# Patient Record
Sex: Female | Born: 1985 | Race: White | Hispanic: No
Health system: Southern US, Community
[De-identification: ages and names within clinical notes are randomized; demographics above are authoritative.]

## PROBLEM LIST (undated history)

## (undated) DIAGNOSIS — O139 Gestational [pregnancy-induced] hypertension without significant proteinuria, unspecified trimester: Secondary | ICD-10-CM

## (undated) DIAGNOSIS — I10 Essential (primary) hypertension: Secondary | ICD-10-CM

## (undated) DIAGNOSIS — R011 Cardiac murmur, unspecified: Secondary | ICD-10-CM

## (undated) DIAGNOSIS — D649 Anemia, unspecified: Secondary | ICD-10-CM

## (undated) HISTORY — PX: NO PAST SURGERIES: SHX2092

---

## 2010-08-05 ENCOUNTER — Ambulatory Visit: Payer: Self-pay | Admitting: Family Medicine

## 2013-10-10 ENCOUNTER — Ambulatory Visit: Payer: Self-pay | Admitting: Oncology

## 2013-10-18 ENCOUNTER — Ambulatory Visit: Payer: Self-pay | Admitting: Oncology

## 2013-10-18 LAB — CBC CANCER CENTER
Basophil #: 0.1 x10 3/mm (ref 0.0–0.1)
Basophil %: 0.5 %
Eosinophil #: 0.3 10*3/uL (ref 0.0–0.7)
Eosinophil %: 2.5 %
HCT: 43 % (ref 35.0–47.0)
HGB: 14.4 g/dL (ref 12.0–16.0)
LYMPHS PCT: 31.9 %
Lymphocyte #: 3.8 x10 3/mm — ABNORMAL HIGH (ref 1.0–3.6)
MCH: 30.2 pg (ref 26.0–34.0)
MCHC: 33.5 g/dL (ref 32.0–36.0)
MCV: 90 fL (ref 80–100)
MONO ABS: 0.9 x10 3/mm (ref 0.2–0.9)
Monocyte %: 7.2 %
NEUTROS ABS: 6.9 x10 3/mm — AB (ref 1.4–6.5)
Neutrophil %: 57.9 %
PLATELETS: 355 x10 3/mm (ref 150–440)
RBC: 4.77 10*6/uL (ref 3.80–5.20)
RDW: 13.1 % (ref 11.5–14.5)
WBC: 12 x10 3/mm — ABNORMAL HIGH (ref 3.6–11.0)

## 2013-10-18 LAB — LACTATE DEHYDROGENASE: LDH: 152 U/L (ref 81–246)

## 2013-10-20 ENCOUNTER — Ambulatory Visit: Payer: Self-pay | Admitting: Oncology

## 2015-11-19 ENCOUNTER — Emergency Department: Payer: Worker's Compensation

## 2015-11-19 ENCOUNTER — Emergency Department
Admission: EM | Admit: 2015-11-19 | Discharge: 2015-11-19 | Disposition: A | Discharge status: Home / Self Care | Payer: Worker's Compensation | Attending: Emergency Medicine | Admitting: Emergency Medicine

## 2015-11-19 DIAGNOSIS — S0990XD Unspecified injury of head, subsequent encounter: Secondary | ICD-10-CM | POA: Diagnosis present

## 2015-11-19 DIAGNOSIS — F0781 Postconcussional syndrome: Secondary | ICD-10-CM | POA: Diagnosis not present

## 2015-11-19 DIAGNOSIS — W2209XD Striking against other stationary object, subsequent encounter: Secondary | ICD-10-CM | POA: Insufficient documentation

## 2015-11-19 DIAGNOSIS — Y9301 Activity, walking, marching and hiking: Secondary | ICD-10-CM | POA: Insufficient documentation

## 2015-11-19 DIAGNOSIS — S0101XD Laceration without foreign body of scalp, subsequent encounter: Secondary | ICD-10-CM | POA: Diagnosis not present

## 2015-11-19 HISTORY — DX: Cardiac murmur, unspecified: R01.1

## 2015-11-19 MED ORDER — NAPROXEN 500 MG PO TABS: 500.0000 mg | ORAL_TABLET | Freq: Two times a day (BID) | ORAL | Status: DC

## 2015-11-19 NOTE — ED Notes (Signed)
Pt states she works at [employer] and when walking through automatic doors the large magnet that opens the door fell onto her head tuesday, denies loc was seen [facility] and had surgical clue for lac repair.the patient c/o continued HA

## 2015-11-19 NOTE — ED Provider Notes (Signed)
[facility] Emergency Department Provider Note  ____________________________________________  Time seen: Approximately 11:19 AM  I have reviewed the triage vital signs and the nursing notes.   HISTORY  Chief Complaint Concussion    HPI Ashley Santos is a 30 y.o. female patient complaining of left frontal headache secondary to blunt trauma. Patient states while walking through an automatic door the magnet that holds the door open fell striking her on the top left side of her head. Patient stated there was no loss of consciousness and was sent to the urgent care secondary to have a scalp laceration. Patient states since the incident she's had a severe headache is not controlled with over-the-counter medications. Patient denies any vertigo or vision disturbance. Patient is currently rating the pain is 3/10.   Past Medical History  Diagnosis Date   Murmur, cardiac     There are no active problems to display for this patient.   History reviewed. No pertinent past surgical history.  No current outpatient prescriptions on file.  Allergies Review of patient's allergies indicates no known allergies.  No family history on file.  Social History Social History  Substance Use Topics   Smoking status: Never Smoker    Smokeless tobacco: None   Alcohol Use: Yes    Review of Systems Constitutional: No fever/chills Eyes: No visual changes. ENT: No sore throat. Cardiovascular: Denies chest pain. Respiratory: Denies shortness of breath. Gastrointestinal: No abdominal pain.  No nausea, no vomiting.  No diarrhea.  No constipation. Genitourinary: Negative for dysuria. Musculoskeletal: Negative for back pain. Skin: Negative for rash. Neurological: Positive for left frontal headaches, but denies focal weakness or numbness.   ____________________________________________   PHYSICAL EXAM:  VITAL SIGNS: ED Triage Vitals  Enc Vitals Group     BP  11/19/15 1050 121/86 mmHg     Pulse Rate 11/19/15 1050 72     Resp 11/19/15 1050 16     Temp 11/19/15 1050 97.5 F (36.4 C)     Temp Source 11/19/15 1050 Oral     SpO2 11/19/15 1050 99 %     Weight 11/19/15 1050 180 lb (81.647 kg)     Height 11/19/15 1050 5\' 1"  (1.549 m)     Head Cir --      Peak Flow --      Pain Score 11/19/15 1050 3     Pain Loc --      Pain Edu? --      Excl. in [place]? --     Constitutional: Alert and oriented. Well appearing and in no acute distress. Eyes: Conjunctivae are normal. PERRL. EOMI. Head: Atraumatic. Lacerations. Left aspect of scalp Nose: No congestion/rhinnorhea. Mouth/Throat: Mucous membranes are moist.  Oropharynx non-erythematous. Neck: No stridor.  No cervical spine tenderness to palpation. Hematological/Lymphatic/Immunilogical: No cervical lymphadenopathy. Cardiovascular: Normal rate, regular rhythm. Grossly normal heart sounds.  Good peripheral circulation. Respiratory: Normal respiratory effort.  No retractions. Lungs CTAB. Gastrointestinal: Soft and nontender. No distention. No abdominal bruits. No CVA tenderness. Musculoskeletal: No lower extremity tenderness nor edema.  No joint effusions. Neurologic:  Normal speech and language. No gross focal neurologic deficits are appreciated. No gait instability. Skin:  Skin is warm, dry and intact. No rash noted. Psychiatric: Mood and affect are normal. Speech and behavior are normal.  ____________________________________________   LABS (all labs ordered are listed, but only abnormal results are displayed)  Labs Reviewed - No data to display ____________________________________________  EKG   ____________________________________________  RADIOLOGY  No acute  finding on head CT ____________________________________________   PROCEDURES  Procedure(s) performed: None  Critical Care performed: No  ____________________________________________   INITIAL IMPRESSION / ASSESSMENT AND  PLAN / ED COURSE  Pertinent labs & imaging results that were available during my care of the patient were reviewed by me and considered in my medical decision making (see chart for details).  Postconcussion headache. Patient given discharge Instructions. Patient advised to follow-up with [facility] if no improvement in 5 days. Patient a prescription for naproxen. ____________________________________________   FINAL CLINICAL IMPRESSION(S) / ED DIAGNOSES  Final diagnoses:  Postconcussion syndrome      Sable Feil, PA-C 11/19/15 [place], MD 11/19/15 [phone]

## 2016-06-07 ENCOUNTER — Other Ambulatory Visit: Payer: Self-pay

## 2016-06-07 ENCOUNTER — Ambulatory Visit
Admission: RE | Admit: 2016-06-07 | Discharge: 2016-06-07 | Disposition: A | Discharge status: Home / Self Care | Payer: 59 | Source: Ambulatory Visit

## 2016-06-07 DIAGNOSIS — R112 Nausea with vomiting, unspecified: Secondary | ICD-10-CM

## 2016-06-07 DIAGNOSIS — K2100 Gastro-esophageal reflux disease with esophagitis, without bleeding: Secondary | ICD-10-CM

## 2016-06-13 ENCOUNTER — Other Ambulatory Visit (HOSPITAL_COMMUNITY): Payer: Self-pay | Admitting: Physician Assistant

## 2016-06-13 DIAGNOSIS — R1011 Right upper quadrant pain: Principal | ICD-10-CM

## 2016-06-13 DIAGNOSIS — G8929 Other chronic pain: Secondary | ICD-10-CM

## 2016-06-13 DIAGNOSIS — R112 Nausea with vomiting, unspecified: Secondary | ICD-10-CM

## 2016-06-21 ENCOUNTER — Ambulatory Visit (HOSPITAL_COMMUNITY)
Admission: RE | Admit: 2016-06-21 | Discharge: 2016-06-21 | Disposition: A | Discharge status: Home / Self Care | Payer: 59 | Source: Ambulatory Visit | Attending: Physician Assistant | Admitting: Physician Assistant

## 2016-06-21 DIAGNOSIS — R1010 Upper abdominal pain, unspecified: Secondary | ICD-10-CM | POA: Insufficient documentation

## 2016-06-21 DIAGNOSIS — G8929 Other chronic pain: Secondary | ICD-10-CM

## 2016-06-21 DIAGNOSIS — R1011 Right upper quadrant pain: Secondary | ICD-10-CM

## 2016-06-21 DIAGNOSIS — R112 Nausea with vomiting, unspecified: Secondary | ICD-10-CM | POA: Diagnosis present

## 2016-06-21 MED ORDER — TECHNETIUM TC 99M EXAMETAZIME IV KIT
5.2000 | PACK | Freq: Once | INTRAVENOUS | Status: AC | PRN
  Administered 2016-06-21: 5 via INTRAVENOUS

## 2018-08-01 LAB — OB RESULTS CONSOLE HIV ANTIBODY (ROUTINE TESTING): HIV: NONREACTIVE

## 2018-08-01 LAB — OB RESULTS CONSOLE ABO/RH: RH Type: POSITIVE

## 2018-08-01 LAB — OB RESULTS CONSOLE RUBELLA ANTIBODY, IGM: Rubella: NON-IMMUNE/NOT IMMUNE

## 2018-08-01 LAB — OB RESULTS CONSOLE RPR: RPR: NONREACTIVE

## 2018-08-01 LAB — OB RESULTS CONSOLE ANTIBODY SCREEN: Antibody Screen: NEGATIVE

## 2018-08-01 LAB — OB RESULTS CONSOLE HEPATITIS B SURFACE ANTIGEN: Hepatitis B Surface Ag: NEGATIVE

## 2018-08-15 ENCOUNTER — Ambulatory Visit: Admit: 2018-08-15 | Payer: 59 | Admitting: Obstetrics and Gynecology

## 2018-08-15 SURGERY — LAPAROSCOPY, DIAGNOSTIC
Anesthesia: Choice

## 2018-09-19 NOTE — L&D Delivery Note (Signed)
Delivery Note Late Entry delivery note:  Called to room for precipitous delivery.  MD on the way.   At 7:30 AM a viable and healthy female was delivered via Vaginal, Spontaneous (Presentation: OA).  APGAR: 5, 6; weight 5 lb 5.5 oz (2424 g).   Placenta status: spontaneous and grossly intact with 3 vessel Cord:  with the following complications: none  Anesthesia:  none Episiotomy: None Lacerations: 1st degree;Labial;Perineal Suture Repair: per Dr Simona Huh Est. Blood Loss (mL): 100  Mom to postpartum.  Baby to Couplet care / Skin to Skin.  Hansel Feinstein 03/05/2019, 8:50 AM

## 2018-09-19 NOTE — L&D Delivery Note (Signed)
Delivery Note Upon my arrival, baby and placenta delivered.  Baby skin to skin.  Mother doing well.  No active bleeding from vagina.  Anesthesia:  Lidocaine Episiotomy: None Lacerations: 1st degree;Labial;Perineal Suture Repair: 2.0 3.0 chromic Est. Blood Loss (mL):  100  Mom to postpartum.  Baby to Couplet care / Skin to Skin.  Thurnell Lose 02/20/2019, 8:30 AM

## 2019-02-19 ENCOUNTER — Inpatient Hospital Stay (HOSPITAL_COMMUNITY)
Admission: AD | Admit: 2019-02-19 | Discharge: 2019-02-23 | DRG: 805 | Disposition: A | Discharge status: Home / Self Care | Payer: Managed Care, Other (non HMO) | Attending: Obstetrics and Gynecology | Admitting: Obstetrics and Gynecology

## 2019-02-19 ENCOUNTER — Encounter (HOSPITAL_COMMUNITY): Payer: Self-pay | Admitting: *Deleted

## 2019-02-19 ENCOUNTER — Other Ambulatory Visit: Payer: Self-pay

## 2019-02-19 ENCOUNTER — Inpatient Hospital Stay (HOSPITAL_COMMUNITY): Payer: Managed Care, Other (non HMO)

## 2019-02-19 DIAGNOSIS — H47619 Cortical blindness, unspecified side of brain: Secondary | ICD-10-CM | POA: Diagnosis present

## 2019-02-19 DIAGNOSIS — Z1159 Encounter for screening for other viral diseases: Secondary | ICD-10-CM | POA: Diagnosis not present

## 2019-02-19 DIAGNOSIS — I6783 Posterior reversible encephalopathy syndrome: Secondary | ICD-10-CM | POA: Diagnosis present

## 2019-02-19 DIAGNOSIS — O9989 Other specified diseases and conditions complicating pregnancy, childbirth and the puerperium: Secondary | ICD-10-CM | POA: Diagnosis present

## 2019-02-19 DIAGNOSIS — O1413 Severe pre-eclampsia, third trimester: Secondary | ICD-10-CM | POA: Diagnosis present

## 2019-02-19 DIAGNOSIS — O9081 Anemia of the puerperium: Secondary | ICD-10-CM | POA: Diagnosis not present

## 2019-02-19 DIAGNOSIS — Z3A36 36 weeks gestation of pregnancy: Secondary | ICD-10-CM | POA: Diagnosis not present

## 2019-02-19 DIAGNOSIS — O9942 Diseases of the circulatory system complicating childbirth: Secondary | ICD-10-CM | POA: Diagnosis present

## 2019-02-19 DIAGNOSIS — O99824 Streptococcus B carrier state complicating childbirth: Secondary | ICD-10-CM | POA: Diagnosis present

## 2019-02-19 DIAGNOSIS — I340 Nonrheumatic mitral (valve) insufficiency: Secondary | ICD-10-CM | POA: Diagnosis present

## 2019-02-19 DIAGNOSIS — R51 Headache: Secondary | ICD-10-CM | POA: Diagnosis present

## 2019-02-19 DIAGNOSIS — H53459 Other localized visual field defect, unspecified eye: Secondary | ICD-10-CM | POA: Diagnosis present

## 2019-02-19 DIAGNOSIS — O1414 Severe pre-eclampsia complicating childbirth: Principal | ICD-10-CM | POA: Diagnosis present

## 2019-02-19 DIAGNOSIS — Z3A39 39 weeks gestation of pregnancy: Secondary | ICD-10-CM | POA: Diagnosis not present

## 2019-02-19 LAB — COMPREHENSIVE METABOLIC PANEL
ALT: 14 U/L (ref 0–44)
ALT: 14 U/L (ref 0–44)
AST: 20 U/L (ref 15–41)
AST: 20 U/L (ref 15–41)
Albumin: 2.6 g/dL — ABNORMAL LOW (ref 3.5–5.0)
Albumin: 2.5 g/dL — ABNORMAL LOW (ref 3.5–5.0)
Alkaline Phosphatase: 125 U/L (ref 38–126)
Alkaline Phosphatase: 123 U/L (ref 38–126)
Anion gap: 10 (ref 5–15)
Anion gap: 10 (ref 5–15)
BUN: 6 mg/dL (ref 6–20)
BUN: 8 mg/dL (ref 6–20)
CO2: 22 mmol/L (ref 22–32)
CO2: 22 mmol/L (ref 22–32)
Calcium: 9.4 mg/dL (ref 8.9–10.3)
Calcium: 8.5 mg/dL — ABNORMAL LOW (ref 8.9–10.3)
Chloride: 107 mmol/L (ref 98–111)
Chloride: 105 mmol/L (ref 98–111)
Creatinine, Ser: 0.65 mg/dL (ref 0.44–1.00)
Creatinine, Ser: 0.71 mg/dL (ref 0.44–1.00)
GFR calc Af Amer: >60 mL/min (ref >60)
GFR calc Af Amer: >60 mL/min (ref >60)
GFR calc non Af Amer: >60 mL/min (ref >60)
GFR calc non Af Amer: >60 mL/min (ref >60)
Glucose, Bld: 93 mg/dL (ref 70–99)
Glucose, Bld: 127 mg/dL — ABNORMAL HIGH (ref 70–99)
Potassium: 3.4 mmol/L — ABNORMAL LOW (ref 3.5–5.1)
Potassium: 3.6 mmol/L (ref 3.5–5.1)
Sodium: 139 mmol/L (ref 135–145)
Sodium: 137 mmol/L (ref 135–145)
Total Bilirubin: 0.5 mg/dL (ref 0.3–1.2)
Total Bilirubin: 0.5 mg/dL (ref 0.3–1.2)
Total Protein: 6.3 g/dL — ABNORMAL LOW (ref 6.5–8.1)
Total Protein: 6.3 g/dL — ABNORMAL LOW (ref 6.5–8.1)

## 2019-02-19 LAB — CBC
HCT: 35.4 % — ABNORMAL LOW (ref 36.0–46.0)
Hemoglobin: 12.7 g/dL (ref 12.0–15.0)
MCH: 32.3 pg (ref 26.0–34.0)
MCHC: 35.9 g/dL (ref 30.0–36.0)
MCV: 90.1 fL (ref 80.0–100.0)
Platelets: 252 10*3/uL (ref 150–400)
RBC: 3.93 MIL/uL (ref 3.87–5.11)
RDW: 13.1 % (ref 11.5–15.5)
WBC: 17.3 10*3/uL — ABNORMAL HIGH (ref 4.0–10.5)
nRBC: 0 % (ref 0.0–0.2)

## 2019-02-19 LAB — TYPE AND SCREEN
ABO/RH(D): O POS
Antibody Screen: NEGATIVE

## 2019-02-19 LAB — MAGNESIUM: Magnesium: 4.6 mg/dL — ABNORMAL HIGH (ref 1.7–2.4)

## 2019-02-19 LAB — PROTEIN / CREATININE RATIO, URINE
Creatinine, Urine: 33.82 mg/dL
Protein Creatinine Ratio: 4.38 mg/mg{Cre} — ABNORMAL HIGH (ref 0.00–0.15)
Total Protein, Urine: 148 mg/dL

## 2019-02-19 LAB — SARS CORONAVIRUS 2 BY RT PCR (HOSPITAL ORDER, PERFORMED IN ~~LOC~~ HOSPITAL LAB): SARS Coronavirus 2: NEGATIVE

## 2019-02-19 MED ORDER — SODIUM CHLORIDE 0.9 % IV SOLN
5.0000 10*6.[IU] | Freq: Once | INTRAVENOUS | Status: AC
  Administered 2019-02-19: 5 10*6.[IU] via INTRAVENOUS
  Filled 2019-02-19: qty 5

## 2019-02-19 MED ORDER — LABETALOL HCL 200 MG PO TABS
200.0000 mg | ORAL_TABLET | Freq: Two times a day (BID) | ORAL | Status: DC
  Administered 2019-02-19: 200 mg via ORAL
  Filled 2019-02-19: qty 1

## 2019-02-19 MED ORDER — FENTANYL CITRATE (PF) 100 MCG/2ML IJ SOLN
50.0000 ug | INTRAMUSCULAR | Status: DC | PRN
  Administered 2019-02-19 – 2019-02-20 (×7): 50 ug via INTRAVENOUS
  Filled 2019-02-19 (×7): qty 2

## 2019-02-19 MED ORDER — LACTATED RINGERS IV SOLN: 500.0000 mL | INTRAVENOUS | Status: DC | PRN

## 2019-02-19 MED ORDER — PENICILLIN G 3 MILLION UNITS IVPB - SIMPLE MED
3.0000 10*6.[IU] | INTRAVENOUS | Status: DC
  Administered 2019-02-19 – 2019-02-20 (×4): 3 10*6.[IU] via INTRAVENOUS
  Filled 2019-02-19 (×7): qty 100

## 2019-02-19 MED ORDER — LABETALOL HCL 5 MG/ML IV SOLN
40.0000 mg | INTRAVENOUS | Status: DC | PRN
  Administered 2019-02-19: 40 mg via INTRAVENOUS
  Filled 2019-02-19: qty 8

## 2019-02-19 MED ORDER — ACETAMINOPHEN 325 MG PO TABS: 650.0000 mg | ORAL_TABLET | ORAL | Status: DC | PRN

## 2019-02-19 MED ORDER — ONDANSETRON HCL 4 MG/2ML IJ SOLN
4.0000 mg | Freq: Four times a day (QID) | INTRAMUSCULAR | Status: DC | PRN
  Administered 2019-02-19 – 2019-02-20 (×2): 4 mg via INTRAVENOUS
  Filled 2019-02-19 (×3): qty 2

## 2019-02-19 MED ORDER — SOD CITRATE-CITRIC ACID 500-334 MG/5ML PO SOLN: 30.0000 mL | ORAL | Status: DC | PRN

## 2019-02-19 MED ORDER — LACTATED RINGERS IV SOLN
INTRAVENOUS | Status: DC
  Administered 2019-02-19 – 2019-02-20 (×4): via INTRAVENOUS

## 2019-02-19 MED ORDER — MAGNESIUM SULFATE 40 G IN LACTATED RINGERS - SIMPLE
2.0000 g/h | INTRAVENOUS | Status: DC
  Administered 2019-02-19 – 2019-02-20 (×3): 2 g/h via INTRAVENOUS
  Filled 2019-02-19 (×2): qty 500

## 2019-02-19 MED ORDER — TERBUTALINE SULFATE 1 MG/ML IJ SOLN: 0.2500 mg | Freq: Once | INTRAMUSCULAR | Status: DC | PRN

## 2019-02-19 MED ORDER — PROMETHAZINE HCL 25 MG/ML IJ SOLN
12.5000 mg | Freq: Four times a day (QID) | INTRAMUSCULAR | Status: DC | PRN
  Administered 2019-02-19: 12.5 mg via INTRAVENOUS
  Filled 2019-02-19: qty 1

## 2019-02-19 MED ORDER — BUTORPHANOL TARTRATE 1 MG/ML IJ SOLN: 1.0000 mg | INTRAMUSCULAR | Status: DC | PRN

## 2019-02-19 MED ORDER — LABETALOL HCL 200 MG PO TABS
200.0000 mg | ORAL_TABLET | Freq: Two times a day (BID) | ORAL | Status: DC
  Administered 2019-02-20 (×2): 200 mg via ORAL
  Filled 2019-02-19 (×2): qty 1

## 2019-02-19 MED ORDER — MISOPROSTOL 25 MCG QUARTER TABLET
25.0000 ug | ORAL_TABLET | ORAL | Status: DC | PRN
  Filled 2019-02-19: qty 1

## 2019-02-19 MED ORDER — ONDANSETRON HCL 4 MG/2ML IJ SOLN
4.0000 mg | Freq: Once | INTRAMUSCULAR | Status: AC
  Administered 2019-02-19: 4 mg via INTRAVENOUS

## 2019-02-19 MED ORDER — OXYCODONE-ACETAMINOPHEN 5-325 MG PO TABS: 1.0000 | ORAL_TABLET | ORAL | Status: DC | PRN

## 2019-02-19 MED ORDER — OXYTOCIN 40 UNITS IN NORMAL SALINE INFUSION - SIMPLE MED
1.0000 m[IU]/min | INTRAVENOUS | Status: DC
  Administered 2019-02-19: 1 m[IU]/min via INTRAVENOUS
  Administered 2019-02-20: 7 m[IU]/min via INTRAVENOUS

## 2019-02-19 MED ORDER — BUTALBITAL-APAP-CAFFEINE 50-325-40 MG PO TABS
1.0000 | ORAL_TABLET | ORAL | Status: AC
  Administered 2019-02-19: 1 via ORAL
  Filled 2019-02-19: qty 1

## 2019-02-19 MED ORDER — OXYCODONE-ACETAMINOPHEN 5-325 MG PO TABS: 2.0000 | ORAL_TABLET | ORAL | Status: DC | PRN

## 2019-02-19 MED ORDER — LIDOCAINE HCL (PF) 1 % IJ SOLN
30.0000 mL | INTRAMUSCULAR | Status: AC | PRN
  Administered 2019-02-20: 30 mL via SUBCUTANEOUS
  Filled 2019-02-19: qty 30

## 2019-02-19 MED ORDER — OXYTOCIN 40 UNITS IN NORMAL SALINE INFUSION - SIMPLE MED
2.5000 [IU]/h | INTRAVENOUS | Status: DC
  Administered 2019-02-20: 2.5 [IU]/h via INTRAVENOUS
  Filled 2019-02-19 (×2): qty 1000

## 2019-02-19 MED ORDER — OXYTOCIN BOLUS FROM INFUSION
500.0000 mL | Freq: Once | INTRAVENOUS | Status: AC
  Administered 2019-02-20: 500 mL via INTRAVENOUS

## 2019-02-19 MED ORDER — LABETALOL HCL 5 MG/ML IV SOLN
20.0000 mg | INTRAVENOUS | Status: DC | PRN
  Administered 2019-02-19: 20 mg via INTRAVENOUS
  Filled 2019-02-19: qty 4

## 2019-02-19 MED ORDER — OXYTOCIN 40 UNITS IN NORMAL SALINE INFUSION - SIMPLE MED: 1.0000 m[IU]/min | INTRAVENOUS | Status: DC

## 2019-02-19 MED ORDER — HYDRALAZINE HCL 20 MG/ML IJ SOLN
10.0000 mg | INTRAMUSCULAR | Status: DC | PRN
  Administered 2019-02-19: 10 mg via INTRAVENOUS

## 2019-02-19 MED ORDER — HYDRALAZINE HCL 20 MG/ML IJ SOLN
5.0000 mg | INTRAMUSCULAR | Status: DC | PRN
  Administered 2019-02-19: 5 mg via INTRAVENOUS
  Filled 2019-02-19: qty 1

## 2019-02-19 NOTE — Progress Notes (Signed)
Ashley Santos is a 33 y.o. G1P0 at [redacted]w[redacted]d admitted for induction of labor due to Pre-eclamptic toxemia of pregnancy with severe features complicated by PRES syndrome.  Subjective: Patient reports feeling better, her headache is improving. Her vision is also improving. She is able to see movement, light, and shadows but cannot clearly see outlines. RN alerted me to the fact that anesthesia would not clear this patient for epidural considering PRES syndrome, patient made aware.  Plan of care made with Dr. Charlesetta Garibaldi and Dr. Simona Huh with Dr. Shon Baton input. Patient is stable for continued induction of labor as long as there is fetal tolerance of induction, patient's symptoms are not worsening, and her blood pressures remain controlled. Patient made aware that if any of these change, we may discuss cesarean section. Patient made aware if we recommended cesarean, she would receive general anesthesia per anesthesiology.   Objective: Vitals:   02/19/19 1832 02/19/19 1902 02/19/19 1930 02/19/19 2000  BP: (!) 148/97 (!) 146/89 123/87 (!) 153/100  Pulse: 79 76 91 92  Resp: 20 18 18 20   Temp:   98.5 F (36.9 C)   TempSrc:   Oral   SpO2:   99%   Weight:      Height:        FHT:  FHR: 130s bpm, variability: moderate,  accelerations:  Abscent,  decelerations:  Absent UC:   irregular, every 1-3 minutes SVE:   Dilation: 1 Effacement (%): 50 Station: -2 Exam by:: Ranee Gosselin CNM   Physical Exam  Constitutional: She is oriented to person, place, and time. She appears lethargic.  Eyes: Pupils are equal, round, and reactive to light. EOM are normal.  Neurological: She is oriented to person, place, and time. She has normal strength. She appears lethargic. A sensory deficit is present.   CNII: patient can see light, shadow, and movement but not distinct shapes CNIII,IV,VI: PERLA, EOM intact VII: symmetrical movement of face VIII: patient able to hear clearly XI: symmetric shoulder shrug with adequate  force XII: symmetric movement of tongue   Labs: Lab Results  Component Value Date   WBC 17.3 (H) 02/19/2019   HGB 12.7 02/19/2019   HCT 35.4 (L) 02/19/2019   MCV 90.1 02/19/2019   PLT 252 02/19/2019    Assessment / Plan: Induction of labor due to preeclampsia,  progressing well on pitocin  Labor: Progressing normally Preeclampsia:  on magnesium sulfate, no signs or symptoms of toxicity, intake and ouput balanced and labs stable Fetal Wellbeing:  Category I Pain Control:  Labor support without medications I/D:  n/a Anticipated MOD:  NSVD  Marikay Alar 02/19/2019, 8:19 PM

## 2019-02-19 NOTE — MAU Note (Signed)
Admission COVID swab obtained. Pt asymptomatic. Specimen sent to lab.

## 2019-02-19 NOTE — H&P (Addendum)
Ashley Santos is a 33 y.o. female G1 @ 36 5/7 weeks brought in by EMS due to severe headache and unilateral blurry vision.  Upon arrival to 181/111, 187/117.  Pt started having neck pain with point tenderness a the base of her neck 1 week ago.  Pt thought she slept wrong so I prescribed her Flexeril, which she states did not help.  Two days later, she went to a [facility] in Clinic with a more pain and headache.  BP was "190 over something" but was told it was normal b/c she was in pain. Pt reports she had blurry vision in her left eye.  She now can only see light in both eyes.  She can not see shadows.  Headache is 7/10.  Has not resolved.  Pt denies upper abdominal pain.   [place] with [facility] has been complicated by +Chlamydia in the first trimester.  TOC neg, repeat at 36 weeks was negative.  Pt has a h/o mitral regurgitation without issue.  OB History    Gravida  1   Para      Term      Preterm      AB      Living        SAB      TAB      Ectopic      Multiple      Live Births             Past Medical History:  Diagnosis Date   Murmur, cardiac    Past Surgical History:  Procedure Laterality Date   NO PAST SURGERIES     Family History: family history includes Heart disease in her mother. Social History:  reports that she has never smoked. She has never used smokeless tobacco. She reports current alcohol use. She reports that she does not use drugs.     Maternal Diabetes: No Genetic Screening: Normal Maternal Ultrasounds/Referrals: Normal Fetal Ultrasounds or other Referrals:  None Maternal Substance Abuse:  No Significant Maternal Medications:  None Significant Maternal Lab Results:  Lab values include: Group B Strep positive Other Comments:  Preeclampsia with severe features  Review of Systems  HENT:       Having difficulty hearing.  Eyes: Positive for blurred vision.  Respiratory: Negative for shortness of breath.   Cardiovascular: Negative for chest  pain.  Gastrointestinal: Positive for abdominal pain.  Musculoskeletal: Positive for neck pain.  Neurological: Positive for headaches.   Maternal Medical History:  Contractions: Perceived severity is moderate.    Fetal activity: Perceived fetal activity is normal.    Prenatal complications: no prenatal complications Prenatal Complications - Diabetes: none.    Dilation: 1 Effacement (%): 50 Station: Ballotable Exam by:: Cecelia Byars, RN Blood pressure (!) 166/103, pulse 89, temperature 98.6 F (37 C), temperature source Oral, resp. rate 20, height 5\' 1"  (1.549 m), weight 83.9 kg, SpO2 98 %. Maternal Exam:  Uterine Assessment: Contraction strength is moderate.  Contraction frequency is regular.   Abdomen: Patient reports no abdominal tenderness. Estimated fetal weight is 6.5 lbs.   Fetal presentation: vertex  Introitus: not evaluated.     Fetal Exam Fetal Monitor Review: Mode: fetoscope.   Baseline rate: 140s.  Variability: moderate (6-25 bpm).   Pattern: accelerations present and no decelerations.    Fetal State Assessment: Category I - tracings are normal.     Physical Exam  Constitutional: She is oriented to person, place, and time. She appears well-developed and well-nourished. She  appears distressed.  Pt seems to have an increased pain with headache with contractions.  HENT:  Head: Normocephalic and atraumatic.  Eyes: EOM are normal.  Fixed pupils bilaterally.  Neck: Normal range of motion.  Cardiovascular: Normal rate and regular rhythm.  Respiratory: Effort normal and breath sounds normal. She has no wheezes.  GI: There is no abdominal tenderness.  Musculoskeletal: Normal range of motion.        General: Edema present. No tenderness.  Neurological: She is alert and oriented to person, place, and time.  3+  Skin: Skin is warm and dry.  Psychiatric:  Answers questions appropriately.    Prenatal labs: ABO, Rh: --/--/O POS (06/02 1145) Antibody:  PENDING (06/02 1145) Rubella: Nonimmune (11/13 1159) RPR: Nonreactive (11/13 1159)  HBsAg: Negative (11/13 1159)  HIV: Non-reactive (11/13 1159)  GBS:   Positive  CBC    Component Value Date/Time   WBC 17.3 (H) 02/19/2019 1148   RBC 3.93 02/19/2019 1148   HGB 12.7 02/19/2019 1148   HGB 14.4 10/18/2013 1044   HCT 35.4 (L) 02/19/2019 1148   HCT 43.0 10/18/2013 1044   PLT 252 02/19/2019 1148   PLT 355 10/18/2013 1044   MCV 90.1 02/19/2019 1148   MCV 90 10/18/2013 1044   MCH 32.3 02/19/2019 1148   MCHC 35.9 02/19/2019 1148   RDW 13.1 02/19/2019 1148   RDW 13.1 10/18/2013 1044   LYMPHSABS 3.8 (H) 10/18/2013 1044   MONOABS 0.9 10/18/2013 1044   EOSABS 0.3 10/18/2013 1044   BASOSABS 0.1 10/18/2013 1044   CMP     Component Value Date/Time   NA 139 02/19/2019 1148   K 3.4 (L) 02/19/2019 1148   CL 107 02/19/2019 1148   CO2 22 02/19/2019 1148   GLUCOSE 93 02/19/2019 1148   BUN 6 02/19/2019 1148   CREATININE 0.65 02/19/2019 1148   CALCIUM 9.4 02/19/2019 1148   PROT 6.3 (L) 02/19/2019 1148   ALBUMIN 2.6 (L) 02/19/2019 1148   AST 20 02/19/2019 1148   ALT 14 02/19/2019 1148   ALKPHOS 125 02/19/2019 1148   BILITOT 0.5 02/19/2019 1148   GFRNONAA >60 02/19/2019 1148   GFRAA >60 02/19/2019 1148   PC  Assessment/Plan: IUP @ 36 5/7 weeks Preeclampsia with severe features by BP and visual changes.  -Magnesium bolus started in route to ER.  2 gram infusion started on admission.  Check Magnesium level  in 6 hours and adjust accordingly.  -Due to physical exam findings, Neurology consult obtained.  Consulted with Dr. Delma Freeze.  He recommended  a stat MRI.  Suspects PRES.  He also recommends being aggressive managing her BP.  Pt was given IV  Hydralazine 5,10 and Labetalol 20 IV.  Start Labetalol 200 mg BID.  -Induction of labor.  Pt contracting regularly.  Start Pitocin after MRI.  -Fioricet given for headache.  If no improvement, try IV medication. GBS+  Start Penicillin. H/o Mitral  regurgitation.  Normal exam, asymptomatic. Plan for vaginal delivery, c-section as indicated. Plan of care d/w Dr. Gertie Exon, MFM extensively.  Agrees with above POC. Ok to take pt off unit to do MRI to assess for more concerning conditions.  I d/w RN and requested she go with pt to MRI and perform dopplers immediately before and after MRI.     Thurnell Lose 02/19/2019, 12:29 PM

## 2019-02-19 NOTE — Consult Note (Addendum)
Neurology Consultation  Reason for Consult: HA/ Loss of vision/  Referring Physician: Irving Burton   History is obtained from: daughter  HPI: Ashley Santos is a 33 y.o. female Cardiac Murmur and currently pregnant. Patient states she went to sleep at 2230 on 02/19/2019 and had a HA along with neck pain. Upon waking today she had spots in her vision which then became loss of vision. 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 She had no other symptoms other than nausea.    LKW: 22:30 02/19/2019 tpa given?: no, no focal deficits Premorbid modified Rankin scale (mRS): 0  ROS: A 14 point ROS was performed and is negative except as noted in the HPI.   Past Medical History:  Diagnosis Date   Murmur, cardiac     Family History  Problem Relation Age of Onset   Heart disease Mother    Social History:   reports that she has never smoked. She has never used smokeless tobacco. She reports current alcohol use. She reports that she does not use drugs.  Medications  Current Facility-Administered Medications:    acetaminophen (TYLENOL) tablet 650 mg, 650 mg, Oral, Q4H PRN, Thurnell Lose, MD   butorphanol (STADOL) injection 1 mg, 1 mg, Intravenous, Q1H PRN, Thurnell Lose, MD   hydrALAZINE (APRESOLINE) injection 5 mg, 5 mg, Intravenous, PRN, 5 mg at 02/19/19 1127 **AND** hydrALAZINE (APRESOLINE) injection 10 mg, 10 mg, Intravenous, PRN, 10 mg at 02/19/19 1140 **AND** labetalol (NORMODYNE) injection 20 mg, 20 mg, Intravenous, PRN, 20 mg at 02/19/19 1150 **AND** labetalol (NORMODYNE) injection 40 mg, 40 mg, Intravenous, PRN, 40 mg at 02/19/19 1218 **AND** Measure blood pressure, , , Once, Thurnell Lose, MD   labetalol (NORMODYNE) tablet 200 mg, 200 mg, Oral, BID, Thurnell Lose, MD, 200 mg at 02/19/19 1307   lactated ringers infusion 500-1,000 mL, 500-1,000 mL, Intravenous, PRN, Thurnell Lose, MD   lactated ringers infusion, , Intravenous, Continuous, Thurnell Lose, MD,  Stopped at 02/19/19 1256   lidocaine (PF) (XYLOCAINE) 1 % injection 30 mL, 30 mL, Subcutaneous, PRN, Thurnell Lose, MD   magnesium sulfate 40 grams in LR 500 mL OB infusion, 2 g/hr, Intravenous, Continuous, Thurnell Lose, MD, Last Rate: 25 mL/hr at 02/19/19 1300, 2 g/hr at 02/19/19 1300   misoprostol (CYTOTEC) tablet 25 mcg, 25 mcg, Vaginal, Q4H PRN, Thurnell Lose, MD   ondansetron [facility]) injection 4 mg, 4 mg, Intravenous, Q6H PRN, Thurnell Lose, MD, 4 mg at 02/19/19 1232   oxyCODONE-acetaminophen (PERCOCET/ROXICET) 5-325 MG per tablet 1 tablet, 1 tablet, Oral, Q4H PRN, Thurnell Lose, MD   oxyCODONE-acetaminophen (PERCOCET/ROXICET) 5-325 MG per tablet 2 tablet, 2 tablet, Oral, Q4H PRN, Thurnell Lose, MD   oxytocin (PITOCIN) IV BOLUS FROM BAG, 500 mL, Intravenous, Once, Thurnell Lose, MD   oxytocin (PITOCIN) IV infusion 40 units in NS 1000 mL - Premix, 2.5 Units/hr, Intravenous, Continuous, Thurnell Lose, MD   oxytocin (PITOCIN) IV infusion 40 units in NS 1000 mL - Premix, 1-40 milli-units/min, Intravenous, Titrated, Thurnell Lose, MD   [COMPLETED] penicillin G potassium 5 Million Units in sodium chloride 0.9 % 250 mL IVPB, 5 Million Units, Intravenous, Once, Last Rate: 250 mL/hr at 02/19/19 1254, 5 Million Units at 02/19/19 1254 **FOLLOWED BY** penicillin G 3 million units in sodium chloride 0.9% 100 mL IVPB, 3 Million Units, Intravenous, Q4H, Thurnell Lose, MD   sodium citrate-citric acid (ORACIT) solution 30 mL, 30 mL, Oral, Q2H PRN, Thurnell Lose, MD   terbutaline (BRETHINE) injection 0.25 mg, 0.25 mg, Subcutaneous,  Once PRN, Thurnell Lose, MD   terbutaline (BRETHINE) injection 0.25 mg, 0.25 mg, Subcutaneous, Once PRN, Thurnell Lose, MD   Exam: Current vital signs: BP (!) 142/82    Pulse 85    Temp 98.6 F (37 C) (Oral)    Resp 18    Ht 5\' 1"  (1.549 m)    Wt 83.9 kg    SpO2 98%    BMI 34.96 kg/m  Vital signs in last 24 hours: Temp:  [98.6 F (37 C)]  98.6 F (37 C) (06/02 1110) Pulse Rate:  [79-97] 85 (06/02 1248) Resp:  [18-20] 18 (06/02 1248) BP: (142-187)/(45-121) 142/82 (06/02 1350) SpO2:  [98 %] 98 % (06/02 1248) Weight:  [83.9 kg] 83.9 kg (06/02 1121)  Physical Exam  Constitutional: Appears well-developed and well-nourished.  Psych: Affect appropriate to situation Eyes: No scleral injection HENT: No OP obstrucion Head: Normocephalic.  Cardiovascular: Normal rate and regular rhythm.  Respiratory: Effort normal, non-labored breathing GI: Soft.  No distension. There is no tenderness.  Skin: WDI  Neuro: Mental Status: Patient is awake, alert, oriented to person, place, month, year, and situation. Patient is able to give a clear and coherent history. No signs of aphasia or neglect Cranial Nerves: II: cortically blind  III,IV, VI: EOMI without ptosis or diploplia. Pupils equal, round and reactive to light V: Facial sensation is symmetric to temperature VII: Facial movement is symmetric.  VIII: hearing is intact to voice X: Palat elevates symmetrically XI: Shoulder shrug is symmetric. XII: tongue is midline without atrophy or fasciculations.  Motor: Tone is normal. Bulk is normal. 5/5 strength was present in all four extremities.  Sensory: Sensation is symmetric to light touch and temperature in the arms and legs. Deep Tendon Reflexes: 3+ and symmetric in the biceps and patellae and 2 at achilles Plantars: Toes are downgoing bilaterally.  Cerebellar: FNF   Labs I have reviewed labs in epic and the results pertinent to this consultation are:   CBC    Component Value Date/Time   WBC 17.3 (H) 02/19/2019 1148   RBC 3.93 02/19/2019 1148   HGB 12.7 02/19/2019 1148   HGB 14.4 10/18/2013 1044   HCT 35.4 (L) 02/19/2019 1148   HCT 43.0 10/18/2013 1044   PLT 252 02/19/2019 1148   PLT 355 10/18/2013 1044   MCV 90.1 02/19/2019 1148   MCV 90 10/18/2013 1044   MCH 32.3 02/19/2019 1148   MCHC 35.9 02/19/2019 1148    RDW 13.1 02/19/2019 1148   RDW 13.1 10/18/2013 1044   LYMPHSABS 3.8 (H) 10/18/2013 1044   MONOABS 0.9 10/18/2013 1044   EOSABS 0.3 10/18/2013 1044   BASOSABS 0.1 10/18/2013 1044    CMP     Component Value Date/Time   NA 139 02/19/2019 1148   K 3.4 (L) 02/19/2019 1148   CL 107 02/19/2019 1148   CO2 22 02/19/2019 1148   GLUCOSE 93 02/19/2019 1148   BUN 6 02/19/2019 1148   CREATININE 0.65 02/19/2019 1148   CALCIUM 9.4 02/19/2019 1148   PROT 6.3 (L) 02/19/2019 1148   ALBUMIN 2.6 (L) 02/19/2019 1148   AST 20 02/19/2019 1148   ALT 14 02/19/2019 1148   ALKPHOS 125 02/19/2019 1148   BILITOT 0.5 02/19/2019 1148   GFRNONAA >60 02/19/2019 1148   GFRAA >60 02/19/2019 1148    Lipid Panel  No results found for: CHOL, TRIG, HDL, CHOLHDL, VLDL, LDLCALC, LDLDIRECT   Imaging I have reviewed the images obtained:  MRI examination of the brain-pending final  reading  Etta Quill PA-C [facility] [phone]  M-F  (9:00 am- 5:00 PM)  02/19/2019, 2:48 PM     Assessment:    NEUROHOSPITALIST ADDENDUM Performed a face to face diagnostic evaluation.   I have reviewed the contents of history and physical exam as documented by PA/ARNP/Resident and agree with above documentation.  I have discussed and formulated the above plan as documented. Edits to the note have been made as needed.  25 70-year-old female who is [redacted] weeks pregnant presents to the emergency department with worsening vision and severe headache.  Patient states that her blood pressure was 244 systolic on Sunday when she went to see her OB/GYN.  Starting Monday patient's noticed a headache that was gradually getting worse.  Today morning when patient woke up she noticed she had spots in her vision that worsened over the next few hours and now she is unable to see anything apart from light perception.  EMS noted her blood pressure was greater than 975 systolic and and  received magnesium bolus.  Patient's OB/GYN was  concerned her pupils were fixed and neurology was consulted.   Patient has severe migraines and has been complaining of headache and neck pain for approximately 2 weeks.  Also has h/o mitral regurgitation.  On examination, alert and oriented and is a good historian.  Pupils are round, equal, 3 mm bilaterally and sluggish.  Patient does not blink to threat bilaterally and unable to distinguish colors, count fingers.  She does not have any other focal neurological deficits.  I strongly suspected patient has PRES given history of severe preeclampsia.  Recommend stat MRI to rule out other dangerous pathologies such as hemorrhage, stroke, cerebral venous thrombosis ( also safer for fetus compared to CT).  After speaking with OB/GYN physician, patient's blood pressure had improved after receiving hydralazine and labetalol and felt she was safe to get a stat MRI. MRI brain is consistent with PRES.   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

## 2019-02-19 NOTE — Progress Notes (Signed)
Pt in MRI department waiting for scan.

## 2019-02-19 NOTE — Progress Notes (Signed)
Ashley Santos is a 33 y.o. G1P0 at [redacted]w[redacted]d  Subjective: Pt reports her vision is improving, can see more light.  Headache is better, controlled with IV pain medications.  Nausea/vomiting has resolved with Phenergan.  Denies upper abdominal pain.  Objective: BP (!) 148/97    Pulse 79    Temp 98.6 F (37 C) (Oral)    Resp 16    Ht 5\' 1"  (1.549 m)    Wt 83.9 kg    SpO2 98%    BMI 34.96 kg/m  No intake/output data recorded. Total I/O In: 605.3 [P.O.:20; I.V.:585.3] Out: 555 [Urine:555]   UOP 320 on arrival 200/4 hours 35/1 hour  Gen:  NAD, somnolent but arousable.  Speech normal.  Answers appropriately. Lungs:  CTA bilaterally, no wheezing or crackles FHT:  FHR: 120s bpm, variability: min-moderate (s/p Fentanyl and Phenergan),  accelerations:  Present,  decelerations:  Absent UC:   regular, every 2-3 minutes SVE:   Dilation: 1 Effacement (%): 70 Station: -2 Exam by:: Josecarlos Harriott Bloody show.  Few dark clots on glove  Labs:  CMP     Component Value Date/Time   NA 137 02/19/2019 1700   K 3.6 02/19/2019 1700   CL 105 02/19/2019 1700   CO2 22 02/19/2019 1700   GLUCOSE 127 (H) 02/19/2019 1700   BUN 8 02/19/2019 1700   CREATININE 0.71 02/19/2019 1700   CALCIUM 8.5 (L) 02/19/2019 1700   PROT 6.3 (L) 02/19/2019 1700   ALBUMIN 2.5 (L) 02/19/2019 1700   AST 20 02/19/2019 1700   ALT 14 02/19/2019 1700   ALKPHOS 123 02/19/2019 1700   BILITOT 0.5 02/19/2019 1700   GFRNONAA >60 02/19/2019 1700   GFRAA >60 02/19/2019 1700   Magnesium 4.6  Assessment / Plan: IUP @ 36 5/7 weeks  Labor: on Pitocin 3 mUs Minimal change.  Increase Pitocin 2 x 2. Preeclampsia:  With severe features  Labs stable.  Magnesium in therapeutic range.  UOP decreasing.  Foley in place.  Strict I/Os, q 1 hour. Creatinine normal.  Consider 250 ml bolus prn  BP mildly elevated.  Continue Labetalol 200 mg q 12 hours.  IV Hydralazine prn.   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   N/V  significantly improved with Phenergan; q 6 prn.  Cortical blindness improving slightly.   Neurology to follow, neuro checks q 6 hours.  Fetal Wellbeing:  Category I Pain Control:  Epidural, IV pain meds and prn I/D:  GBS+ on PCN. Anticipated MOD:  NSVD  Allow clear liquids.  Check out to [facility], Dr. Marolyn Hammock ~ 7 pm.  Pt informed.  Thurnell Lose 02/19/2019, 6:44 PM

## 2019-02-19 NOTE — Progress Notes (Signed)
Dr. Gertie Exon called the unit to speak with patient. Phone call transferred to patient room phone.  E. Carmisha Larusso RN

## 2019-02-19 NOTE — Progress Notes (Signed)
Patient returned from MRI

## 2019-02-19 NOTE — Consult Note (Signed)
MFM Telemedicine Consult note.  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 Simona Huh for consultation regarding preeclampsia with severe features and posterior reversible encephalopathy syndrome.  She notes that the pregnancy overall has gone well. She denied episodes of vaginal bleeding, loss of fluid or decreased fetal movement. She recalls that her prenatal care was uncomplicated.  For the last two weeks she notes an ongoing headache that didn't resolve with tylenol. She notes that this last past Wednesday it worsened to the point of today. She has a history of migraines outside of pregnancy. Today's headache felt similar. She noted persistent sensation of Nausea.  This morning she awakened with blurred vision where should could only see outlines of individuals. She denies having a seizure, including syncope and loss of continence.   While in the MAU she had elevated blood pressures of 180's/ 110's. She Dr. Simona Huh spoke to me regarding loss of vision and dilated pupils. She consulted Neurology and ordered an MRI- that suggested PRES.  Vitals:   02/19/19 2130 02/19/19 2200 02/19/19 2230 02/19/19 2300  BP: (!) 146/91 (!) 133/104 122/69 (!) 152/91  Pulse: 80 93 (!) 101 95  Resp: 18 16  18   Temp:    98.4 F (36.9 C)  TempSrc:    Oral  SpO2:      Weight:      Height:        Active Ambulatory Problems    Diagnosis Date Noted   No Active Ambulatory Problems   Resolved Ambulatory Problems    Diagnosis Date Noted   No Resolved Ambulatory Problems   Past Medical History:  Diagnosis Date   Murmur, cardiac    Past Surgical History:  Procedure Laterality Date   NO PAST SURGERIES     No current facility-administered medications on file prior to encounter.    Current  Outpatient Medications on File Prior to Encounter  Medication Sig Dispense Refill   naproxen (NAPROSYN) 500 MG tablet Take 1 tablet (500 mg total) by mouth 2 (two) times daily with a meal. 20 tablet 0   Family History  Problem Relation Age of Onset   Heart disease Mother    Social History   Socioeconomic History   Marital status: Married    Spouse name: Not on file   Number of children: Not on file   Years of education: Not on file   Highest education level: Not on file  Occupational History   Not on file  Social Needs   Financial resource strain: Not on file   Food insecurity:    Worry: Not on file    Inability: Not on file   Transportation needs:    Medical: Not on file    Non-medical: Not on file  Tobacco Use   Smoking status: Never Smoker   Smokeless tobacco: Never Used  Substance and Sexual Activity   Alcohol use: Yes   Drug use: Never   Sexual activity: Yes  Lifestyle   Physical activity:    Days per week: Not on file    Minutes per session: Not on file   Stress: Not on file  Relationships   Social connections:    Talks on phone: Not on file    Gets together: Not on file    Attends  religious service: Not on file    Active member of club or organization: Not on file    Attends meetings of clubs or organizations: Not on file    Relationship status: Not on file   Intimate partner violence:    Fear of current or ex partner: Not on file    Emotionally abused: Not on file    Physically abused: Not on file    Forced sexual activity: Not on file  Other Topics Concern   Not on file  Social History Narrative   Not on file   Reported going through a divorce.    CBC Latest Ref Rng & Units 02/19/2019 10/18/2013  WBC 4.0 - 10.5 K/uL 17.3(H) 12.0(H)  Hemoglobin 12.0 - 15.0 g/dL 12.7 14.4  Hematocrit 36.0 - 46.0 % 35.4(L) 43.0  Platelets 150 - 400 K/uL 252 355   CMP Latest Ref Rng & Units 02/19/2019 02/19/2019  Glucose 70 - 99 mg/dL 127(H) 93   BUN 6 - 20 mg/dL 8 6  Creatinine 0.44 - 1.00 mg/dL 0.71 0.65  Sodium 135 - 145 mmol/L 137 139  Potassium 3.5 - 5.1 mmol/L 3.6 3.4(L)  Chloride 98 - 111 mmol/L 105 107  CO2 22 - 32 mmol/L 22 22  Calcium 8.9 - 10.3 mg/dL 8.5(L) 9.4  Total Protein 6.5 - 8.1 g/dL 6.3(L) 6.3(L)  Total Bilirubin 0.3 - 1.2 mg/dL 0.5 0.5  Alkaline Phos 38 - 126 U/L 123 125  AST 15 - 41 U/L 20 20  ALT 0 - 44 U/L 14 14   Urinary P/C 4.38 mg/mg (Cre)  She is on magnesium sulfate and has received both oral and IV antihypertensive therapy.  Impression/Counseling:  Preeclampisa with severe features and PRES:  I discussed with Ms. Fehl- Prevette the seriousness of her symptoms. I explained the definition of preeclampsia with severe features and the role of PRES in qualifying severity. I discussed that those with PRES have like had or will have an eclamptic seizure with out the magnesium sulfate.  While on magnesium she reports that her vision has improved.   Her blood pressure is now stabilizing as well.   I explained that the goal is to expedite delivery with in 24-48 hours. However, if her labs worsened she developed pulmonary hypertension or the fetal testing was not reassuring that cesarean delivery may be the appropriate mode of delivery. However, I reiterated that delivery is for fetal or obstetrical indications.  I discussed with Dr. Barkley Boards that continuing toward labor induction is appropriate with appropriate progress.  Inaddition, I did explain that the risk for cesarean delivery was higher given her cervical exam and primigravida status.  At this time continue labor induction with the goal of completed delivery with 24-36 hours. 1) Repeat labs every 6 hours. 2) Maintain strict I/O's  3) Continue magnesium sulfate for seizure prophylaxis 4) Maintain blood pressure 140/90 to 155/105. 5) Cesarean delivery for materna/l fetal indications. 6) Magnesium levels  I spent 40 minutes with >50%  in  non-face to face communication and care coordination.  All questions answered  Vikki Ports, MD.

## 2019-02-19 NOTE — Progress Notes (Signed)
Pt in MRI department with neurologist and PA. Pt nauseated. Severe headache contiunes

## 2019-02-19 NOTE — Progress Notes (Signed)
Informed by RN that pt was back on unit.  Fetal status reassuring. MRI confirms PRES. Pt with c/o nausea/vomiting.  Pt s/p Zofran 4 mg x 2 doses and still vomiting.  Also still rating headache 10/10.  Pt will sleep but then wake up with pain. I wanted to give IV narcotics but wanted to consult with neurology.  Spoke with Dr. Christianne Dolin.  Ok to give IV Fentanyl but recommends not sedating her too heavily b/c they need to do a neuro exam every 6 hours.  He recommends avoiding Tramadol b/c it can cause seizures.  RN given verbal order for Phenergan 25 mg and Fentanyl 50 mcg.

## 2019-02-20 ENCOUNTER — Encounter (HOSPITAL_COMMUNITY): Payer: Self-pay

## 2019-02-20 DIAGNOSIS — O1414 Severe pre-eclampsia complicating childbirth: Secondary | ICD-10-CM

## 2019-02-20 DIAGNOSIS — Z3A39 39 weeks gestation of pregnancy: Secondary | ICD-10-CM

## 2019-02-20 DIAGNOSIS — I6783 Posterior reversible encephalopathy syndrome: Secondary | ICD-10-CM

## 2019-02-20 LAB — CBC WITH DIFFERENTIAL/PLATELET
Abs Immature Granulocytes: 0.18 10*3/uL — ABNORMAL HIGH (ref 0.00–0.07)
Basophils Absolute: 0.1 10*3/uL (ref 0.0–0.1)
Basophils Relative: 0 %
Eosinophils Absolute: 0 10*3/uL (ref 0.0–0.5)
Eosinophils Relative: 0 %
HCT: 35.4 % — ABNORMAL LOW (ref 36.0–46.0)
Hemoglobin: 12.1 g/dL (ref 12.0–15.0)
Immature Granulocytes: 1 %
Lymphocytes Relative: 9 %
Lymphs Abs: 2 10*3/uL (ref 0.7–4.0)
MCH: 31.7 pg (ref 26.0–34.0)
MCHC: 34.2 g/dL (ref 30.0–36.0)
MCV: 92.7 fL (ref 80.0–100.0)
Monocytes Absolute: 1.4 10*3/uL — ABNORMAL HIGH (ref 0.1–1.0)
Monocytes Relative: 6 %
Neutro Abs: 19.2 10*3/uL — ABNORMAL HIGH (ref 1.7–7.7)
Neutrophils Relative %: 84 %
Platelets: 263 10*3/uL (ref 150–400)
RBC: 3.82 MIL/uL — ABNORMAL LOW (ref 3.87–5.11)
RDW: 13.8 % (ref 11.5–15.5)
WBC: 22.8 10*3/uL — ABNORMAL HIGH (ref 4.0–10.5)
nRBC: 0 % (ref 0.0–0.2)

## 2019-02-20 LAB — COMPREHENSIVE METABOLIC PANEL
ALT: 14 U/L (ref 0–44)
ALT: 12 U/L (ref 0–44)
ALT: 11 U/L (ref 0–44)
AST: 18 U/L (ref 15–41)
AST: 21 U/L (ref 15–41)
AST: 21 U/L (ref 15–41)
Albumin: 2.6 g/dL — ABNORMAL LOW (ref 3.5–5.0)
Albumin: 2 g/dL — ABNORMAL LOW (ref 3.5–5.0)
Albumin: 2 g/dL — ABNORMAL LOW (ref 3.5–5.0)
Alkaline Phosphatase: 124 U/L (ref 38–126)
Alkaline Phosphatase: 88 U/L (ref 38–126)
Alkaline Phosphatase: 91 U/L (ref 38–126)
Anion gap: 13 (ref 5–15)
Anion gap: 10 (ref 5–15)
Anion gap: 7 (ref 5–15)
BUN: 10 mg/dL (ref 6–20)
BUN: 8 mg/dL (ref 6–20)
BUN: 11 mg/dL (ref 6–20)
CO2: 20 mmol/L — ABNORMAL LOW (ref 22–32)
CO2: 21 mmol/L — ABNORMAL LOW (ref 22–32)
CO2: 24 mmol/L (ref 22–32)
Calcium: 7.7 mg/dL — ABNORMAL LOW (ref 8.9–10.3)
Calcium: 7.1 mg/dL — ABNORMAL LOW (ref 8.9–10.3)
Calcium: 7.5 mg/dL — ABNORMAL LOW (ref 8.9–10.3)
Chloride: 100 mmol/L (ref 98–111)
Chloride: 104 mmol/L (ref 98–111)
Chloride: 105 mmol/L (ref 98–111)
Creatinine, Ser: 0.89 mg/dL (ref 0.44–1.00)
Creatinine, Ser: 0.9 mg/dL (ref 0.44–1.00)
Creatinine, Ser: 1.1 mg/dL — ABNORMAL HIGH (ref 0.44–1.00)
GFR calc Af Amer: >60 mL/min (ref >60)
GFR calc Af Amer: >60 mL/min (ref >60)
GFR calc Af Amer: >60 mL/min (ref >60)
GFR calc non Af Amer: >60 mL/min (ref >60)
GFR calc non Af Amer: >60 mL/min (ref >60)
GFR calc non Af Amer: >60 mL/min (ref >60)
Glucose, Bld: 144 mg/dL — ABNORMAL HIGH (ref 70–99)
Glucose, Bld: 158 mg/dL — ABNORMAL HIGH (ref 70–99)
Glucose, Bld: 113 mg/dL — ABNORMAL HIGH (ref 70–99)
Potassium: 3.6 mmol/L (ref 3.5–5.1)
Potassium: 3.5 mmol/L (ref 3.5–5.1)
Potassium: 3.9 mmol/L (ref 3.5–5.1)
Sodium: 133 mmol/L — ABNORMAL LOW (ref 135–145)
Sodium: 135 mmol/L (ref 135–145)
Sodium: 136 mmol/L (ref 135–145)
Total Bilirubin: 0.7 mg/dL (ref 0.3–1.2)
Total Bilirubin: 0.2 mg/dL — ABNORMAL LOW (ref 0.3–1.2)
Total Bilirubin: 0.6 mg/dL (ref 0.3–1.2)
Total Protein: 6.1 g/dL — ABNORMAL LOW (ref 6.5–8.1)
Total Protein: 5 g/dL — ABNORMAL LOW (ref 6.5–8.1)
Total Protein: 4.8 g/dL — ABNORMAL LOW (ref 6.5–8.1)

## 2019-02-20 LAB — CBC
HCT: 29.4 % — ABNORMAL LOW (ref 36.0–46.0)
HCT: 28.4 % — ABNORMAL LOW (ref 36.0–46.0)
Hemoglobin: 10.2 g/dL — ABNORMAL LOW (ref 12.0–15.0)
Hemoglobin: 9.8 g/dL — ABNORMAL LOW (ref 12.0–15.0)
MCH: 32.3 pg (ref 26.0–34.0)
MCH: 32.1 pg (ref 26.0–34.0)
MCHC: 34.7 g/dL (ref 30.0–36.0)
MCHC: 34.5 g/dL (ref 30.0–36.0)
MCV: 93 fL (ref 80.0–100.0)
MCV: 93.1 fL (ref 80.0–100.0)
Platelets: 229 10*3/uL (ref 150–400)
Platelets: 221 10*3/uL (ref 150–400)
RBC: 3.16 MIL/uL — ABNORMAL LOW (ref 3.87–5.11)
RBC: 3.05 MIL/uL — ABNORMAL LOW (ref 3.87–5.11)
RDW: 13.7 % (ref 11.5–15.5)
RDW: 13.9 % (ref 11.5–15.5)
WBC: 26 10*3/uL — ABNORMAL HIGH (ref 4.0–10.5)
WBC: 21.6 10*3/uL — ABNORMAL HIGH (ref 4.0–10.5)
nRBC: 0 % (ref 0.0–0.2)
nRBC: 0 % (ref 0.0–0.2)

## 2019-02-20 LAB — ABO/RH: ABO/RH(D): O POS

## 2019-02-20 LAB — MAGNESIUM
Magnesium: 6.7 mg/dL (ref 1.7–2.4)
Magnesium: 5.2 mg/dL — ABNORMAL HIGH (ref 1.7–2.4)

## 2019-02-20 LAB — RPR: RPR Ser Ql: NONREACTIVE

## 2019-02-20 MED ORDER — MAGNESIUM SULFATE 40 G IN LACTATED RINGERS - SIMPLE
1.0000 g/h | INTRAVENOUS | Status: DC
  Administered 2019-02-20: 1 g/h via INTRAVENOUS

## 2019-02-20 MED ORDER — OXYCODONE HCL 5 MG PO TABS: 10.0000 mg | ORAL_TABLET | ORAL | Status: DC | PRN

## 2019-02-20 MED ORDER — LIDOCAINE HCL (PF) 1 % IJ SOLN
INTRAMUSCULAR | Status: AC
  Filled 2019-02-20: qty 30

## 2019-02-20 MED ORDER — OXYCODONE HCL 5 MG PO TABS: 5.0000 mg | ORAL_TABLET | ORAL | Status: DC | PRN

## 2019-02-20 MED ORDER — SENNOSIDES-DOCUSATE SODIUM 8.6-50 MG PO TABS
2.0000 | ORAL_TABLET | ORAL | Status: DC
  Administered 2019-02-20 – 2019-02-21 (×2): 2 via ORAL
  Filled 2019-02-20 (×2): qty 2

## 2019-02-20 MED ORDER — LIDOCAINE HCL (PF) 1 % IJ SOLN: 30.0000 mL | Freq: Once | INTRAMUSCULAR | Status: DC

## 2019-02-20 MED ORDER — ONDANSETRON HCL 4 MG/2ML IJ SOLN: 4.0000 mg | INTRAMUSCULAR | Status: DC | PRN

## 2019-02-20 MED ORDER — ACETAMINOPHEN 325 MG PO TABS: 650.0000 mg | ORAL_TABLET | ORAL | Status: DC | PRN

## 2019-02-20 MED ORDER — BENZOCAINE-MENTHOL 20-0.5 % EX AERO
1.0000 "application " | INHALATION_SPRAY | CUTANEOUS | Status: DC | PRN
  Administered 2019-02-20: 1 via TOPICAL
  Filled 2019-02-20: qty 56

## 2019-02-20 MED ORDER — PRENATAL MULTIVITAMIN CH
1.0000 | ORAL_TABLET | Freq: Every day | ORAL | Status: DC
  Administered 2019-02-20 – 2019-02-23 (×4): 1 via ORAL
  Filled 2019-02-20 (×4): qty 1

## 2019-02-20 MED ORDER — SIMETHICONE 80 MG PO CHEW: 80.0000 mg | CHEWABLE_TABLET | ORAL | Status: DC | PRN

## 2019-02-20 MED ORDER — TETANUS-DIPHTH-ACELL PERTUSSIS 5-2.5-18.5 LF-MCG/0.5 IM SUSP: 0.5000 mL | Freq: Once | INTRAMUSCULAR | Status: DC

## 2019-02-20 MED ORDER — IBUPROFEN 600 MG PO TABS
600.0000 mg | ORAL_TABLET | Freq: Four times a day (QID) | ORAL | Status: DC
  Administered 2019-02-20 – 2019-02-23 (×13): 600 mg via ORAL
  Filled 2019-02-20 (×14): qty 1

## 2019-02-20 MED ORDER — WITCH HAZEL-GLYCERIN EX PADS: 1.0000 "application " | MEDICATED_PAD | CUTANEOUS | Status: DC | PRN

## 2019-02-20 MED ORDER — MISOPROSTOL 200 MCG PO TABS: 800.0000 ug | ORAL_TABLET | Freq: Once | ORAL | Status: DC | PRN

## 2019-02-20 MED ORDER — DIBUCAINE (PERIANAL) 1 % EX OINT: 1.0000 "application " | TOPICAL_OINTMENT | CUTANEOUS | Status: DC | PRN

## 2019-02-20 MED ORDER — ONDANSETRON HCL 4 MG PO TABS: 4.0000 mg | ORAL_TABLET | ORAL | Status: DC | PRN

## 2019-02-20 MED ORDER — COCONUT OIL OIL
1.0000 "application " | TOPICAL_OIL | Status: DC | PRN
  Administered 2019-02-20: 1 via TOPICAL

## 2019-02-20 MED ORDER — MAGNESIUM HYDROXIDE 400 MG/5ML PO SUSP: 30.0000 mL | ORAL | Status: DC | PRN

## 2019-02-20 MED ORDER — OXYTOCIN 40 UNITS IN NORMAL SALINE INFUSION - SIMPLE MED: 2.5000 [IU]/h | INTRAVENOUS | Status: DC | PRN

## 2019-02-20 MED ORDER — DIPHENHYDRAMINE HCL 25 MG PO CAPS: 25.0000 mg | ORAL_CAPSULE | Freq: Four times a day (QID) | ORAL | Status: DC | PRN

## 2019-02-20 MED ORDER — ZOLPIDEM TARTRATE 5 MG PO TABS: 5.0000 mg | ORAL_TABLET | Freq: Every evening | ORAL | Status: DC | PRN

## 2019-02-20 MED ORDER — LACTATED RINGERS IV SOLN
INTRAVENOUS | Status: DC
  Administered 2019-02-20 – 2019-02-21 (×3): via INTRAVENOUS

## 2019-02-20 NOTE — Lactation Note (Signed)
This note was copied from a baby's chart. Lactation Consultation Note  Patient Name: Ashley Santos [id number] Date: 02/20/2019 Reason for consult: Initial assessment;Late-preterm 34-36.6wks;Primapara;1st time breastfeeding;Infant < 6lbs  P1 mother whose infant is now 22 hours old.  This is a LPTI at 36+6 weeks weighing < 6 lbs  Mother is on magnesium and is having some issues with visual blurriness.  MD and RN in room when I arrived.  Father present.  Spoke with the parents regarding breast feeding and their LPTI.  Reviewed the informational sheet on LPTI and supplementation guidelines.  Mother stated that baby had recently fed well.  She was asleep in father's arms.  Offered to initiate the DEBP and reasons provided as to why pumping is beneficial.  Mother willing to pump.  She is in agreement with the supplementation policy.  Pump parts, assembly, disassembly and cleaning reviewed with both parents.  Suggested father observe pumping closely and assist mother since she could benefit from some assistance.  Father willing to assist as needed.  Discussed breast feeding and pumping during the entire 15 minutes session.  No colostrum noted after this first attempt.  Taught hand expression and mother was unable to obtain drops at this time.  Colostrum container provided and milk storage times reviewed.  Finger feeding demonstrated.  RN will provide mother the Similac and bottles/nipples for supplementation.  Discussed feeding cues but reminded parents that baby may not show cues.  They will awaken every three hours to feed.  Mother will do hand expression before/after feeding/pumping to help increase milk supply.  Mom made aware of O/P services, breastfeeding support groups, community resources, and our phone # for post-discharge questions. Mother stated she has a DEBP for home use.  RN/LC may need to reinforce what was discussed today if mother forgets.  A lot of education was provided.  Encouraged  mother to call RN/LC to observe and assist with latching.  Mother verbalized understanding.  RN updated.    Maternal Data Formula Feeding for Exclusion: No Has patient been taught Hand Expression?: Yes Does the patient have breastfeeding experience prior to this delivery?: No  Feeding Feeding Type: Breast Fed  LATCH Score Latch: Repeated attempts needed to sustain latch, nipple held in mouth throughout feeding, stimulation needed to elicit sucking reflex.  Audible Swallowing: None  Type of Nipple: Everted at rest and after stimulation  Comfort (Breast/Nipple): Soft / non-tender  Hold (Positioning): Full assist, staff holds infant at breast  LATCH Score: 5  Interventions Interventions: Assisted with latch  Lactation Tools Discussed/Used     Consult Status Consult Status: Follow-up Date: 02/20/19 Follow-up type: In-patient    Ashley Santos R Jacklynn Dehaas 02/20/2019, 7:28 PM

## 2019-02-20 NOTE — Progress Notes (Signed)
Ashley Santos is a 33 y.o. G1P0 at [redacted]w[redacted]d admitted for induction of labor due to Pre-eclamptic toxemia of pregnancy with severe features complicated by PRES syndrome.  Subjective: Patient reports feeling better, her headache is improved. Her vision is also improved with only mild deficit. Discussed AROM with patient including risks and benefits. Patient consented. AROM clear.   Objective: Vitals:   02/19/19 2330 02/20/19 0000 02/20/19 0030 02/20/19 0100  BP: 136/88 135/88 127/85 127/81  Pulse: 92 91 91 98  Resp:      Temp:      TempSrc:      SpO2:      Weight:      Height:        FHT:  FHR: 130s bpm, variability: moderate,  accelerations:  Abscent,  decelerations:  Absent UC:   irregular, every 1-3 minutes SVE:   Dilation: 5 Effacement (%): 60, 70 Station: -2 Exam by:: Ranee Gosselin, CNM   Labs: Lab Results  Component Value Date   WBC 17.3 (H) 02/19/2019   HGB 12.7 02/19/2019   HCT 35.4 (L) 02/19/2019   MCV 90.1 02/19/2019   PLT 252 02/19/2019    Assessment / Plan: Induction of labor due to preeclampsia,  progressing well on pitocin  Labor: Progressing normally Preeclampsia:  on magnesium sulfate, no signs or symptoms of toxicity, intake and ouput balanced and labs stable Fetal Wellbeing:  Category I Pain Control:  Labor support without medications I/D:  n/a Anticipated MOD:  NSVD  Marikay Alar 02/20/2019, 1:31 AM

## 2019-02-20 NOTE — Progress Notes (Signed)
Postpartum day #0, NSVD (precipitous)  Subjective Pt reports her vision has mostly  Lochia normal.  Pain controlled.  Breast feeding yes.  Lactation consultant at bedside.   Temp:  [97.7 F (36.5 C)-98.9 F (37.2 C)] 97.8 F (36.6 C) (06/03 1705) Pulse Rate:  [76-108] 92 (06/03 1705) Resp:  [16-20] 17 (06/03 1705) BP: (112-171)/(69-112) 112/71 (06/03 1705) SpO2:  [96 %-99 %] 97 % (06/03 1705)   UOP 50/50/50/150  Gen:  NAD, A&O x 3 Abd:  No upper abdominal pain Uterine fundus:  Firm, nontender Lochia normal Ext:  3+Edema, SCD imprint noted, no calf tenderness bilaterally  CBC    Component Value Date/Time   WBC 26.0 (H) 02/20/2019 1403   RBC 3.16 (L) 02/20/2019 1403   HGB 10.2 (L) 02/20/2019 1403   HGB 14.4 10/18/2013 1044   HCT 29.4 (L) 02/20/2019 1403   HCT 43.0 10/18/2013 1044   PLT 229 02/20/2019 1403   PLT 355 10/18/2013 1044   MCV 93.0 02/20/2019 1403   MCV 90 10/18/2013 1044   MCH 32.3 02/20/2019 1403   MCHC 34.7 02/20/2019 1403   RDW 13.7 02/20/2019 1403   RDW 13.1 10/18/2013 1044   LYMPHSABS 2.0 02/20/2019 0552   LYMPHSABS 3.8 (H) 10/18/2013 1044   MONOABS 1.4 (H) 02/20/2019 0552   MONOABS 0.9 10/18/2013 1044   EOSABS 0.0 02/20/2019 0552   EOSABS 0.3 10/18/2013 1044   BASOSABS 0.1 02/20/2019 0552   BASOSABS 0.1 10/18/2013 1044     A/P: Preeclampsia with severe features complicated by PRES.  Vision is almost back to baseline.  Magnesium at 1 gram per hour.  Repeat magnesium level now and other preeclampsia labs.  Adjust as needed  UOP low the last few hours.  Discontinue foley when 150/3 hours.  BP well controlled.  Continue Labetalol 200 mg q 12 hours. S/p SVD doing well. Routine postpartum care. Lactation support. Baby in room doing well.  Thurnell Lose 02/20/2019, 6:41 PM

## 2019-02-20 NOTE — Progress Notes (Signed)
Ashley Santos is a 33 y.o. G1P0 at [redacted]w[redacted]d admitted for induction of labor due to Pre-eclamptic toxemia of pregnancy with severe features complicated by PRES syndrome.  Subjective: Patient reports feeling more pressure and pain with contractions. She's reporting sharp RUQ pain with contractions.   Discussed patient status with Dr. Charlesetta Garibaldi. Patient with minimal urine output around 30-71ml hourly for the last few hours with overall retention of 1,300+ml fluid. Preeclampsia labs ordered to be drawn this morning but have not resulted yet.   Objective: Vitals:   02/20/19 0330 02/20/19 0400 02/20/19 0430 02/20/19 0500  BP: 117/81 113/84 121/84 121/78  Pulse: 95 89 93 95  Resp: 20 18 18 20   Temp:      TempSrc:      SpO2:      Weight:      Height:        FHT:  FHR: 130s bpm, variability: minimal ,  accelerations:  Abscent,  decelerations:  Absent. Variability has been minimal to moderate, right now baby appears to be in a sleep cycle with minimal variability. Will continue to monitor.  UC:   irregular, every 1-3 minutes SVE:   Dilation: 7 Effacement (%): 60, 70 Station: -1 Exam by:: Ranee Gosselin, CNM   Labs: Lab Results  Component Value Date   WBC 17.3 (H) 02/19/2019   HGB 12.7 02/19/2019   HCT 35.4 (L) 02/19/2019   MCV 90.1 02/19/2019   PLT 252 02/19/2019    Assessment / Plan: Induction of labor due to preeclampsia,  progressing well on pitocin  Labor: Progressing normally Preeclampsia:  on magnesium sulfate, no signs or symptoms of toxicity, intake and ouput balanced and labs stable Fetal Wellbeing:  Category I Pain Control:  Labor support without medications I/D:  n/a Anticipated MOD:  NSVD  Marikay Alar 02/20/2019, 5:30 AM

## 2019-02-20 NOTE — Progress Notes (Addendum)
NEUROLOGY PROGRESS NOTE  Subjective: Patient states that her vision has gotten better.  She still has some difficulty seeing in the right lower quadrant.  Patient does not complain of any headaches at this time.  She did give birth last night and her main desire is to be able to see her baby.  Exam: Vitals:   02/20/19 0845 02/20/19 0915  BP: 118/75 123/85  Pulse: 99 96  Resp:    Temp:    SpO2:      Physical Exam   HEENT-  Normocephalic, no lesions, without obvious abnormality.  Normal external eye and conjunctiva.   Abdomen- All 4 quadrants palpated and nontender Extremities- Warm, dry and intact Musculoskeletal-no joint tenderness, deformity or swelling Skin-warm and dry, no hyperpigmentation, vitiligo, or suspicious lesions    Neuro:  Mental Status: Alert, oriented, thought content appropriate.  Speech fluent without evidence of aphasia.  Able to follow 3 step commands without difficulty. Cranial Nerves: II: Shows a left inferior quadrantanopsia otherwise intact, able to tell colors and follow my fingers with no difficulty III,IV, VI: ptosis not present, extra-ocular motions intact bilaterally pupils equal, round, reactive to light and accommodation V,VII: smile symmetric, facial light touch sensation normal bilaterally VIII: hearing normal bilaterally IX,X: Palate rises midline XI: bilateral shoulder shrug XII: midline tongue extension Motor: Right : Upper extremity   5/5    Left:     Upper extremity   5/5  Lower extremity   5/5     Lower extremity   5/5 Tone and bulk:normal tone throughout; no atrophy noted Sensory: Pinprick and light touch intact throughout, bilaterally Deep Tendon Reflexes: 2+ and symmetric throughout Plantars: Right: downgoing   Left: downgoing      Medications:  Scheduled: . labetalol  200 mg Oral Q12H  . lidocaine (PF)  30 mL Subcutaneous Once  . lidocaine (PF)      . pencillin G potassium IV  3 Million Units Intravenous Q4H    Continuous: . lactated ringers    . lactated ringers 100 mL/hr at 02/20/19 0852  . magnesium sulfate 1 g/hr (02/20/19 0851)  . oxytocin 2.5 Units/hr (02/20/19 0818)  . oxytocin 7 milli-units/min (02/20/19 0307)   ZOX:[id number], butorphanol, fentaNYL (SUBLIMAZE) injection, hydrALAZINE **AND** hydrALAZINE **AND** labetalol **AND** labetalol **AND** Measure blood pressure, lactated ringers, misoprostol, ondansetron, oxyCODONE-acetaminophen, oxyCODONE-acetaminophen, promethazine, sodium citrate-citric acid, terbutaline, terbutaline  Pertinent Labs/Diagnostics:   Mr Brain Wo Contrast  Result Date: 02/19/2019 CLINICAL DATA: . IMPRESSION: Positive for acute posterior reversible encephalopathy syndrome (PRES), with symmetric occipital but also right parietal and even right caudate involvement. No associated hemorrhage or infarct identified. Electronically Signed   By: Odessa Fleming M.D.   On: 02/19/2019 15:08     Felicie Morn PA-C [facility] [phone]   Assessment: 33 year old female with severe preeclampsia yesterday and MRI showing press.  Today has improved significantly and vision is much improved.  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  Blood pressures have been maintained well and kept in the 120 systolically.   Recommendations: -Continue to maintain blood pressure in normotensive range -Again will defer choice of pressure medications to OB/GYN    02/20/2019, 9:22 AM   NEUROHOSPITALIST ADDENDUM Performed a face to face diagnostic evaluation.   I have reviewed the contents of history and physical exam as documented by PA/ARNP/Resident and agree with above documentation.  I have discussed and formulated the above plan as documented. Edits to the note have been made as needed.  Patient's vision has improved significantly and  close to baseline.  She has no obvious visual field cuts on my examination, states she it has improved even from this morning.  She has  delivered her baby without any complications.  Blood pressure less than 120 systolic.  No further recommendations.  Neurology will be available as needed.    Georgiana Spinner Kristia Jupiter MD [facility] [phone]   If 7pm to 7am, please call on call as listed on AMION.

## 2019-02-21 LAB — COMPREHENSIVE METABOLIC PANEL
ALT: 8 U/L (ref 0–44)
AST: 26 U/L (ref 15–41)
Albumin: 2.1 g/dL — ABNORMAL LOW (ref 3.5–5.0)
Alkaline Phosphatase: 91 U/L (ref 38–126)
Anion gap: 5 (ref 5–15)
BUN: 8 mg/dL (ref 6–20)
CO2: 26 mmol/L (ref 22–32)
Calcium: 7.3 mg/dL — ABNORMAL LOW (ref 8.9–10.3)
Chloride: 107 mmol/L (ref 98–111)
Creatinine, Ser: 0.87 mg/dL (ref 0.44–1.00)
GFR calc Af Amer: >60 mL/min (ref >60)
GFR calc non Af Amer: >60 mL/min (ref >60)
Glucose, Bld: 86 mg/dL (ref 70–99)
Potassium: 3.8 mmol/L (ref 3.5–5.1)
Sodium: 138 mmol/L (ref 135–145)
Total Bilirubin: 0.6 mg/dL (ref 0.3–1.2)
Total Protein: 4.8 g/dL — ABNORMAL LOW (ref 6.5–8.1)

## 2019-02-21 LAB — CBC
HCT: 27.9 % — ABNORMAL LOW (ref 36.0–46.0)
Hemoglobin: 9.4 g/dL — ABNORMAL LOW (ref 12.0–15.0)
MCH: 31.9 pg (ref 26.0–34.0)
MCHC: 33.7 g/dL (ref 30.0–36.0)
MCV: 94.6 fL (ref 80.0–100.0)
Platelets: 220 10*3/uL (ref 150–400)
RBC: 2.95 MIL/uL — ABNORMAL LOW (ref 3.87–5.11)
RDW: 14.2 % (ref 11.5–15.5)
WBC: 18.7 10*3/uL — ABNORMAL HIGH (ref 4.0–10.5)
nRBC: 0 % (ref 0.0–0.2)

## 2019-02-21 LAB — MAGNESIUM: Magnesium: 4.3 mg/dL — ABNORMAL HIGH (ref 1.7–2.4)

## 2019-02-21 MED ORDER — NIFEDIPINE ER OSMOTIC RELEASE 30 MG PO TB24
30.0000 mg | ORAL_TABLET | Freq: Every day | ORAL | Status: DC
  Administered 2019-02-21 – 2019-02-22 (×2): 30 mg via ORAL
  Filled 2019-02-21 (×3): qty 1

## 2019-02-21 MED ORDER — FUROSEMIDE 10 MG/ML IJ SOLN
20.0000 mg | Freq: Once | INTRAMUSCULAR | Status: AC
  Administered 2019-02-21: 20 mg via INTRAVENOUS
  Filled 2019-02-21: qty 2

## 2019-02-21 NOTE — Lactation Note (Addendum)
This note was copied from a baby's chart. Lactation Consultation Note:  P1, 75 hour old 36.6 weeks. Infant is tiny at 5-5 and was 5% this am.  Parents had just fed infant when I arrived in the room. Infant took 13 ml of neosure.  Attempt to latch infant on in football hold. Infant to sleepy and showing no signs of hunger. Unable to rouse for feeding. Suggested to wake infant with STS at 2.5 hours to get infant ready for feeding attempt. Mother advised not to allow infant to sleep longer than 4 hours during the day.   Mother advised to attempt for only 20-30 mins when latching . Discussed switch nursing to rouse infant .  Supplement infant using ebm /formula with feeding cues and after each feeding attempt.  Parents have supplemental guidelines .  Lots of teaching with parents on proper latch , positioning, hand expression and importance of pumping consistently.   Assist mother with hand expression and mothers first time observing colostrum drops from both breast.  Mother was given colostrum vials and a curved tip syringe.  Assist father of baby with burping infant and swaddling infant .   Mother was given a hand pump and suggested to try after hand expression. #24 flange fits her .   Suggested mother to page for latch assistance with next feeding. Mother to page staff nurse or [place].  Patient Name: Ashley Santos [id number] Date: 02/21/2019 Reason for consult: Follow-up assessment   Maternal Data    Feeding Feeding Type: Breast Fed  LATCH Score                   Interventions Interventions: Breast feeding basics reviewed;Assisted with latch;Skin to skin;Breast massage;Hand express;Adjust position;Support pillows;Position options;Expressed milk;Hand pump;DEBP  Lactation Tools Discussed/Used     Consult Status Consult Status: Follow-up Date: 02/22/19 Follow-up type: In-patient    [name] [facility] 02/21/2019, 10:41 AM

## 2019-02-21 NOTE — Progress Notes (Signed)
Postpartum day #1, NSVD (precipitous)  Subjective Vision has improved significantly but somewhat spotty.  It looked like her boyfriend had freckles.  Denies headache.  Bleeding minimal.  C/o thickening on bilateral thighs.   Temp:  [97.7 F (36.5 C)-98.3 F (36.8 C)] 98.1 F (36.7 C) (06/04 1200) Pulse Rate:  [78-100] 91 (06/04 1200) Resp:  [16-18] 16 (06/04 1200) BP: (102-140)/(59-89) 140/85 (06/04 1200) SpO2:  [96 %-100 %] 100 % (06/04 0815)   UOP   Gen:  NAD, A&O x 3 Abd:  No upper abdominal pain Uterine fundus:  Firm, nontender Lochia normal Ext:  3+Edema, left thigh with significant edema, no calf tenderness bilaterally  CMP Latest Ref Rng & Units 02/21/2019 02/20/2019 02/20/2019  Glucose 70 - 99 mg/dL 86 113(H) 158(H)  BUN 6 - 20 mg/dL 8 11 8   Creatinine 0.44 - 1.00 mg/dL 0.87 1.10(H) 0.90  Sodium 135 - 145 mmol/L 138 136 135  Potassium 3.5 - 5.1 mmol/L 3.8 3.9 3.5  Chloride 98 - 111 mmol/L 107 105 104  CO2 22 - 32 mmol/L 26 24 21(L)  Calcium 8.9 - 10.3 mg/dL 7.3(L) 7.5(L) 7.1(L)  Total Protein 6.5 - 8.1 g/dL 4.8(L) 4.8(L) 5.0(L)  Total Bilirubin 0.3 - 1.2 mg/dL 0.6 0.6 0.2(L)  Alkaline Phos 38 - 126 U/L 91 91 88  AST 15 - 41 U/L 26 21 21   ALT 0 - 44 U/L 8 11 12    CBC Latest Ref Rng & Units 02/21/2019 02/20/2019 02/20/2019  WBC 4.0 - 10.5 K/uL 18.7(H) 21.6(H) 26.0(H)  Hemoglobin 12.0 - 15.0 g/dL 9.4(L) 9.8(L) 10.2(L)  Hematocrit 36.0 - 46.0 % 27.9(L) 28.4(L) 29.4(L)  Platelets 150 - 400 K/uL 220 221 229   Magnesium 4.3  A/P: Preeclampsia with severe features complicated by PRES.  Vision is almost back to near baseline, likely scotomata. Difficulty focusing likely due to Magnesium.  Continued  Magnesium x 30 + hours awaiting diuresis.  Discontinue now.  Lasix IV 20 mg once now.   Continue Strict I/Os.   Start Procardia XL to control BP after Magnesium discontinued prn. S/p SVD doing well. Routine postpartum care. Lactation support. Discharge BP when well controlled.   Anticipate visual changes to continue to improve. Dr. Nelda Marseille covering tomorrow.  Pt aware.  Thurnell Lose 02/21/2019, 1:02 PM

## 2019-02-22 MED ORDER — NIFEDIPINE ER OSMOTIC RELEASE 30 MG PO TB24
60.0000 mg | ORAL_TABLET | Freq: Every day | ORAL | Status: DC
  Administered 2019-02-23: 60 mg via ORAL
  Filled 2019-02-22: qty 2

## 2019-02-22 MED ORDER — NIFEDIPINE ER OSMOTIC RELEASE 30 MG PO TB24: 30.0000 mg | ORAL_TABLET | Freq: Once | ORAL | Status: DC

## 2019-02-22 MED ORDER — NIFEDIPINE ER OSMOTIC RELEASE 30 MG PO TB24
30.0000 mg | ORAL_TABLET | Freq: Every day | ORAL | Status: DC
  Administered 2019-02-22: 30 mg via ORAL
  Filled 2019-02-22: qty 1

## 2019-02-22 NOTE — Lactation Note (Signed)
This note was copied from a baby's chart. Lactation Consultation Note  Patient Name: Ashley Santos [id number] Date: 02/22/2019 Reason for consult: Follow-up assessment;Early term 37-38.6wks;Infant < 6lbs;Other (Comment)(SGA)  78 hours old LPI female who is being partially BF and formula fed by her mother. Per mom BF is going better now and baby is finally latching on to the breast. However mom hasn't been pumping, but she's been supplementing with Similac 22 calorie formula after feedings at the breast. Explained to mom the importance of consistent pumping in order to keep the cycle of Lactogenesis II. Baby is SGA < 6 lbs and at 6% weight loss now and mom understands that even when she gets to take baby home; she's most likely not going to be able to empty the breast. Discussed supply and demand, engorgement prevention and treatment, mom does have a DEBP at home. Mom didn't required any assistance with latch at this point; she reported all questions and concerns were answered, she's aware of [facility] services and will call if any questions or concerns arise.  Maternal Data    Feeding Feeding Type: Breast Fed  LATCH Score                   Interventions Interventions: Breast feeding basics reviewed  Lactation Tools Discussed/Used     Consult Status Consult Status: Follow-up Date: 02/23/19 Follow-up type: In-patient    Caileigh Canche Francene Boyers 02/22/2019, 2:20 PM

## 2019-02-22 NOTE — Progress Notes (Signed)
Postpartum day #2, NSVD (precipitous)  Subjective Still noting some spotting in her vision, but better than previously.  Denies headache or RUQ pain.  Tolerating gen diet.  Ambulating and voiding freely.  Moderate lochia.  No F/C/CP/SOB.  No other acute complaints  Temp:  [98.1 F (36.7 C)-98.5 F (36.9 C)] 98.5 F (36.9 C) (06/05 0501) Pulse Rate:  [77-91] 79 (06/05 0501) Resp:  [16-18] 18 (06/05 0501) BP: (131-147)/(76-92) 140/84 (06/05 0501) SpO2:  [97 %-100 %] 100 % (06/05 0501)  UOP: 1800cc/8hr   Gen:  NAD, A&O x 3 CV: RRR Lungs: CTAB Abd:  No upper abdominal pain Uterine fundus:  Firm, nontender, below umbilicus Lochia normal Ext:  2+ bilateral edema, no clonus  Results for orders placed or performed during the hospital encounter of 02/19/19 (from the past 24 hour(s))  CBC     Status: Abnormal   Collection Time: 02/21/19  8:24 AM  Result Value Ref Range   WBC 18.7 (H) 4.0 - 10.5 K/uL   RBC 2.95 (L) 3.87 - 5.11 MIL/uL   Hemoglobin 9.4 (L) 12.0 - 15.0 g/dL   HCT 81.1 (L) 91.4 - 78.2 %   MCV 94.6 80.0 - 100.0 fL   MCH 31.9 26.0 - 34.0 pg   MCHC 33.7 30.0 - 36.0 g/dL   RDW 95.6 21.3 - 08.6 %   Platelets 220 150 - 400 K/uL   nRBC 0.0 0.0 - 0.2 %  Comprehensive metabolic panel     Status: Abnormal   Collection Time: 02/21/19  8:24 AM  Result Value Ref Range   Sodium 138 135 - 145 mmol/L   Potassium 3.8 3.5 - 5.1 mmol/L   Chloride 107 98 - 111 mmol/L   CO2 26 22 - 32 mmol/L   Glucose, Bld 86 70 - 99 mg/dL   BUN 8 6 - 20 mg/dL   Creatinine, Ser 5.78 0.44 - 1.00 mg/dL   Calcium 7.3 (L) 8.9 - 10.3 mg/dL   Total Protein 4.8 (L) 6.5 - 8.1 g/dL   Albumin 2.1 (L) 3.5 - 5.0 g/dL   AST 26 15 - 41 U/L   ALT 8 0 - 44 U/L   Alkaline Phosphatase 91 38 - 126 U/L   Total Bilirubin 0.6 0.3 - 1.2 mg/dL   GFR calc non Af Amer >60 >60 mL/min   GFR calc Af Amer >60 >60 mL/min   Anion gap 5 5 - 15  Magnesium     Status: Abnormal   Collection Time: 02/21/19  8:24 AM  Result Value  Ref Range   Magnesium 4.3 (H) 1.7 - 2.4 mg/dL    A/P: 46NG [id number] s/p NSVD, PPD#2 Preeclampsia with severe features complicated by PRES.  Vision is almost back to near baseline, likely scotomata.  S/p Magnesium and IV Lasix 20mg  x 1  Currently on Procardia 30XL, BP within high normal, plan to monitor for another day  UOP adequate; however diuresis has been slow S/p SVD doing well. Routine postpartum care. Lactation support.  DISPO: Continue in-house monitoring, will consider discharge home tomorrow  Myna Hidalgo, DO [phone] (cell) [phone] (office)

## 2019-02-23 DIAGNOSIS — O9081 Anemia of the puerperium: Secondary | ICD-10-CM | POA: Diagnosis not present

## 2019-02-23 DIAGNOSIS — I6783 Posterior reversible encephalopathy syndrome: Secondary | ICD-10-CM | POA: Diagnosis present

## 2019-02-23 DIAGNOSIS — I340 Nonrheumatic mitral (valve) insufficiency: Secondary | ICD-10-CM | POA: Diagnosis present

## 2019-02-23 MED ORDER — HYDROCHLOROTHIAZIDE 12.5 MG PO CAPS
12.5000 mg | ORAL_CAPSULE | Freq: Every day | ORAL | Status: DC
  Administered 2019-02-23: 12.5 mg via ORAL
  Filled 2019-02-23: qty 1

## 2019-02-23 MED ORDER — NIFEDIPINE ER OSMOTIC RELEASE 30 MG PO TB24: 90.0000 mg | ORAL_TABLET | Freq: Every day | ORAL | Status: DC

## 2019-02-23 MED ORDER — NIFEDIPINE ER OSMOTIC RELEASE 30 MG PO TB24: 30.0000 mg | ORAL_TABLET | Freq: Every day | ORAL | Status: DC

## 2019-02-23 MED ORDER — NIFEDIPINE ER OSMOTIC RELEASE 30 MG PO TB24
30.0000 mg | ORAL_TABLET | Freq: Once | ORAL | Status: AC
  Administered 2019-02-23: 30 mg via ORAL
  Filled 2019-02-23: qty 1

## 2019-02-23 MED ORDER — NIFEDIPINE ER OSMOTIC RELEASE 90 MG PO TB24: 90.0000 mg | ORAL_TABLET | Freq: Every day | ORAL | 1 refills | Status: AC

## 2019-02-23 MED ORDER — HYDROCHLOROTHIAZIDE 12.5 MG PO CAPS: 12.5000 mg | ORAL_CAPSULE | Freq: Every day | ORAL | 0 refills | Status: DC

## 2019-02-23 MED ORDER — NIFEDIPINE ER 60 MG PO TB24: 60.0000 mg | ORAL_TABLET | Freq: Every day | ORAL | 1 refills | Status: DC

## 2019-02-23 MED ORDER — MEASLES, MUMPS & RUBELLA VAC IJ SOLR
0.5000 mL | Freq: Once | INTRAMUSCULAR | Status: AC
  Administered 2019-02-23: 0.5 mL via SUBCUTANEOUS
  Filled 2019-02-23: qty 0.5

## 2019-02-23 NOTE — Discharge Summary (Addendum)
SVD OB Discharge Summary     Patient Name: Ashley Santos DOB: September 13, 1986 MRN: [id number]  Date of admission: 02/19/2019 Delivering MD: Seabron Spates  Date of delivery: 02/20/2019 Type of delivery: SVD  Newborn Data: Sex: Baby female  Live born female  Birth Weight: 5 lb 5.5 oz (2424 g) APGAR: 5, 6  Newborn Delivery   Birth date/time:  02/20/2019 07:30:00 Delivery type:  Vaginal, Spontaneous     Feeding: breast Infant being discharge to home with mother in stable condition.   Admitting diagnosis: ELEVATED BP AND HEADACHES Intrauterine pregnancy: [redacted]w[redacted]d     Secondary diagnosis:  Active Problems:   Preeclampsia, severe, third trimester   Postpartum anemia   PRES (posterior reversible encephalopathy syndrome)   Normal postpartum course   Mitral regurgitation                                Complications: PRES                                                             Intrapartum Procedures: spontaneous vaginal delivery Postpartum Procedures: 24 H PP magnesium Complications-Operative and Postpartum: 1st degree perineal laceration Augmentation: AROM, Pitocin, Cytotec and Foley Balloon   History of Present Illness: Ms. Ashley Santos is a 33 y.o. female, G1P0101, who presents at [redacted]w[redacted]d weeks gestation. The patient has been followed at  [facility] and Gyne[facility]  Her pregnancy has been complicated by:  Patient Active Problem List   Diagnosis Date Noted   Postpartum anemia 02/23/2019   PRES (posterior reversible encephalopathy syndrome) 02/23/2019   Normal postpartum course 02/23/2019   Mitral regurgitation 02/23/2019   Preeclampsia, severe, third trimester 02/19/2019    Hospital course:  Induction of Labor With Vaginal Delivery   33 y.o. yo G1P0101 at [redacted]w[redacted]d was admitted to the hospital 02/19/2019 for induction of labor.  Indication for induction: Preeclampsia.  Ashley Santos is a 33 y.o. female G1 @ 36 5/7 weeks brought in by EMS due to  severe headache and unilateral blurry vision.  Upon arrival to 181/111, 187/117.  Pt started having neck pain with point tenderness a the base of her neck 1 week ago.  Pt thought she slept wrong so I prescribed her Flexeril, which she states did not help.  Two days later, she went to a Walk in Clinic with a more pain and headache.  BP was "190 over something" but was told it was normal b/c she was in pain. Pt reports she had blurry vision in her left eye.  She now can only see light in both eyes.  She can not see shadows.  Headache is 7/10.  Has not resolved.  Pt denies upper abdominal pain.   Ogle with [facility] has been complicated by +Chlamydia in the first trimester.  TOC neg, repeat at 36 weeks was negative.  Pt has a h/o mitral regurgitation without issue.Patient had an uncomplicated labor course as follows: Membrane Rupture Time/Date: 1:21 AM ,02/20/2019   Intrapartum Procedures: Episiotomy: None [1]  Lacerations:  1st degree [2];Labial [10];Perineal [11]  Patient had delivery of a Viable infant.  Information for the patient's newborn:  Ashley, Santos [[id number]]  Delivery Method: Vag-Spont   02/20/2019  Details of delivery can be found in separate delivery note.  Patient had a routine postpartum course. Patient is discharged home 02/23/19. Pt was give lasix PP for 3+ pitting edema for diuresing, Pt stable and reports feeling much better now, pt endorses still having swelling in her lower extremities, but denies HA< eye pain, no cp, sob, no coughs, no blurred vision or RUQ pain. Creatinine on day 1 PP was 1.1, then on day 2PP decreased to 0.87. Pt I&O have been recorded and normal output/adaquate. >264ml/hr Postpartum Day # 4 : S/P NSVD due to stated above. Patient up ad lib, denies syncope or dizziness. Reports consuming regular diet without issues and denies N/V. Patient reports 0 bowel movement + passing flatus.  Denies issues with urination and  reports bleeding is "lighter."  Patient is breastfeeding and reports going well.  Desires undecided for postpartum contraception.  Pain is being appropriately managed with use of po meds. BP was elevated PP and after 24 hour PP mag, therefore Dr Nelda Marseille started pt on 60mg  xl daily procardia, this morning BP was 149/95, will monitor BP today, added 12.5mg  PO HCTZ to help with 2+ pitting edema. Consulted with Dr Charlesetta Garibaldi and she verbalized agreement.     Addendum 4:24 PM pt was unable to leave the hospital, waiting for husband to get off work, noted elevated Bps, last 159/102. Added another 30xl mg PO procardia to be given nowConsulted with Dr Charlesetta Garibaldi, increased procardia to 90xl PO daily. Called to pharmacy and made them aware of change, Pt is asymptomatic and stable, Per Dr Charlesetta Garibaldi pt is still ok to go home. Close f/u with [facility].   Physical exam  Vitals:   02/22/19 2041 02/23/19 0540 02/23/19 1230 02/23/19 1445  BP: 137/85 (!) 149/95 (!) 155/108 (!) 159/102  Pulse: 92 91 89 87  Resp: 18 17    Temp: 98 F (36.7 C) 98.2 F (36.8 C)    TempSrc: Oral Oral    SpO2: 100% 99%    Weight:      Height:       General: alert, cooperative and no distress Lochia: appropriate Uterine Fundus: firm Perineum: Approximate, no hematoma noted.  DVT Evaluation: No evidence of DVT seen on physical exam. Negative Homan's sign. No cords or calf tenderness. 2+ pitting edema ot lower extremitates.  No clonus, 2+ Patellar DTRs.   Labs: Lab Results  Component Value Date   WBC 18.7 (H) 02/21/2019   HGB 9.4 (L) 02/21/2019   HCT 27.9 (L) 02/21/2019   MCV 94.6 02/21/2019   PLT 220 02/21/2019   CMP Latest Ref Rng & Units 02/21/2019  Glucose 70 - 99 mg/dL 86  BUN 6 - 20 mg/dL 8  Creatinine 0.44 - 1.00 mg/dL 0.87  Sodium 135 - 145 mmol/L 138  Potassium 3.5 - 5.1 mmol/L 3.8  Chloride 98 - 111 mmol/L 107  CO2 22 - 32 mmol/L 26  Calcium 8.9 - 10.3 mg/dL 7.3(L)  Total Protein 6.5 - 8.1 g/dL 4.8(L)  Total  Bilirubin 0.3 - 1.2 mg/dL 0.6  Alkaline Phos 38 - 126 U/L 91  AST 15 - 41 U/L 26  ALT 0 - 44 U/L 8    Date of discharge: 02/23/2019 Discharge Diagnoses: PTD due to PreE with SF, complicated by PRES.  Discharge instruction:  per After Visit Summary and "Baby and Me Booklet".  After visit meds:    Activity:           unrestricted and pelvic rest Advance as tolerated. Pelvic rest for 6 weeks.  Diet:                routine Medications: PNV, Ibuprofen, Colace and Iron Postpartum contraception: Undecided Condition:  Pt discharge to home with baby in stable PP Anemia: Iron TID PrE with SF c/b Pres: F/u with [facility] on Tuesday 6/9 for BP check, continue HCTZ 12.5 mg PO daily for swelling and edema, and procardia 90 xl, report s/sx.   Meds: Allergies as of 02/23/2019   No Known Allergies     Medication List    STOP taking these medications   oxyCODONE-acetaminophen 5-325 MG tablet Commonly known as:  PERCOCET/ROXICET     TAKE these medications   cyclobenzaprine 10 MG tablet Commonly known as:  FLEXERIL Take 10 mg by mouth 3 (three) times daily as needed for muscle spasms.   hydrochlorothiazide 12.5 MG capsule Commonly known as:  MICROZIDE Take 1 capsule (12.5 mg total) by mouth daily.   NIFEdipine 90 MG 24 hr tablet Commonly known as:  PROCARDIA XL/NIFEDICAL-XL Take 1 tablet (90 mg total) by mouth daily. Start taking on:  February 24, 2019       Discharge Follow Up:  Follow-up Information    [facility]ne[facility], [place] Follow up on 02/26/2019.   Specialty:  Obstetrics and Gynecology Contact information: [address] [place] [address] [phone]            [place], NP-C, CNM 02/23/2019, 4:24 PM  Noralyn Pick, [place]

## 2019-02-23 NOTE — Progress Notes (Signed)
Patient's blood pressure 155/108 at 1230, but was due to receive Hydrochlorothiazide and Procardia.  Midwife updated and clarification received for amount of Procardia which was 60 mg.  Received verbal instruction to recheck blood pressure in a couple of hours and call back with result if it is not improved.  Bp at 1445 was 159/102 with patient lying in the bed resting.  She has voided 975 since medication given.  Midwife updated again and she stated she would call me back with new orders in about 30 min.  Report will be given to oncoming RN.

## 2019-02-23 NOTE — Progress Notes (Signed)
Reviewed last 3 B/P's with [facility] and patient's output. Confirmed patient was still to be discharged.

## 2019-02-23 NOTE — Lactation Note (Signed)
This note was copied from a baby's chart. Lactation Consultation Note  Patient Name: Ashley Santos [id number] Date: 02/23/2019 Reason for consult: Follow-up assessment;Infant < 6lbs;1st time breastfeeding;Primapara;Late-preterm 34-36.6wks;Infant weight loss(5 % weight loss )  Baby is 52 hours old  LC reviewed potential feeding behaviors of an early baby also the importance of the two weeks of establishing milk supply.  Sore nipple and engorgement reviewed.  Per mom has a DEBP at home.  Mom aware of the [place] resources after D/C.      Maternal Data    Feeding Feeding Type: (per mom last fed at 11 am and then 11:30am ) Nipple Type: Slow - flow  LATCH Score                   Interventions Interventions: Breast feeding basics reviewed  Lactation Tools Discussed/Used WIC Program: No Pump Review: Milk Storage   Consult Status Consult Status: (LC offered to request and LC O/P for a week from Monday or Tuesday ) Date: (mom receptive ) Follow-up type: ([place] placed a request in the Epic basket )    Jerlyn Ly Xion Debruyne 02/23/2019, 12:34 PM

## 2019-02-26 ENCOUNTER — Encounter (HOSPITAL_COMMUNITY): Payer: Self-pay | Admitting: *Deleted

## 2019-02-26 ENCOUNTER — Other Ambulatory Visit: Payer: Self-pay

## 2019-02-26 ENCOUNTER — Inpatient Hospital Stay (HOSPITAL_COMMUNITY)
Admission: AD | Admit: 2019-02-26 | Discharge: 2019-02-26 | Disposition: A | Discharge status: Home / Self Care | Payer: Managed Care, Other (non HMO) | Attending: Obstetrics and Gynecology | Admitting: Obstetrics and Gynecology

## 2019-02-26 DIAGNOSIS — O1495 Unspecified pre-eclampsia, complicating the puerperium: Secondary | ICD-10-CM | POA: Insufficient documentation

## 2019-02-26 DIAGNOSIS — R51 Headache: Secondary | ICD-10-CM | POA: Diagnosis present

## 2019-02-26 HISTORY — DX: Gestational (pregnancy-induced) hypertension without significant proteinuria, unspecified trimester: O13.9

## 2019-02-26 HISTORY — DX: Anemia, unspecified: D64.9

## 2019-02-26 HISTORY — DX: Essential (primary) hypertension: I10

## 2019-02-26 LAB — COMPREHENSIVE METABOLIC PANEL
ALT: 20 U/L (ref 0–44)
AST: 24 U/L (ref 15–41)
Albumin: 3.1 g/dL — ABNORMAL LOW (ref 3.5–5.0)
Alkaline Phosphatase: 111 U/L (ref 38–126)
Anion gap: 11 (ref 5–15)
BUN: 18 mg/dL (ref 6–20)
CO2: 24 mmol/L (ref 22–32)
Calcium: 9.3 mg/dL (ref 8.9–10.3)
Chloride: 102 mmol/L (ref 98–111)
Creatinine, Ser: 0.79 mg/dL (ref 0.44–1.00)
GFR calc Af Amer: >60 mL/min (ref >60)
GFR calc non Af Amer: >60 mL/min (ref >60)
Glucose, Bld: 87 mg/dL (ref 70–99)
Potassium: 3.4 mmol/L — ABNORMAL LOW (ref 3.5–5.1)
Sodium: 137 mmol/L (ref 135–145)
Total Bilirubin: 0.7 mg/dL (ref 0.3–1.2)
Total Protein: 6.9 g/dL (ref 6.5–8.1)

## 2019-02-26 LAB — URINALYSIS, ROUTINE W REFLEX MICROSCOPIC
Bilirubin Urine: NEGATIVE
Glucose, UA: NEGATIVE mg/dL
Ketones, ur: NEGATIVE mg/dL
Nitrite: NEGATIVE
Protein, ur: 30 mg/dL — AB
Specific Gravity, Urine: 1.005 (ref 1.005–1.030)
pH: 7 (ref 5.0–8.0)

## 2019-02-26 LAB — CBC
HCT: 39.1 % (ref 36.0–46.0)
Hemoglobin: 13.7 g/dL (ref 12.0–15.0)
MCH: 32.3 pg (ref 26.0–34.0)
MCHC: 35 g/dL (ref 30.0–36.0)
MCV: 92.2 fL (ref 80.0–100.0)
Platelets: 389 10*3/uL (ref 150–400)
RBC: 4.24 MIL/uL (ref 3.87–5.11)
RDW: 13 % (ref 11.5–15.5)
WBC: 14 10*3/uL — ABNORMAL HIGH (ref 4.0–10.5)
nRBC: 0 % (ref 0.0–0.2)

## 2019-02-26 MED ORDER — BUTALBITAL-APAP-CAFFEINE 50-325-40 MG PO TABS
2.0000 | ORAL_TABLET | Freq: Once | ORAL | Status: AC
  Administered 2019-02-26: 2 via ORAL
  Filled 2019-02-26: qty 2

## 2019-02-26 MED ORDER — HYDROCHLOROTHIAZIDE 25 MG PO TABS: 25.0000 mg | ORAL_TABLET | Freq: Every day | ORAL | 1 refills | Status: AC

## 2019-02-26 MED ORDER — HYDROCHLOROTHIAZIDE 12.5 MG PO CAPS
12.5000 mg | ORAL_CAPSULE | Freq: Once | ORAL | Status: AC
  Administered 2019-02-26: 12.5 mg via ORAL
  Filled 2019-02-26: qty 1

## 2019-02-26 NOTE — MAU Provider Note (Signed)
History     CSN: [id number]  Arrival date and time: 02/26/19 1251   First Provider Initiated Contact with Patient 02/26/19 1332      Chief Complaint  Patient presents with   Headache   Hypertension   Ashley Santos is a 33 y.o. G1P0101 who is 6 days S/P NSVD. She had pre-eclampsia while here and was given magnesium during her labor course. She was sent home on HCTZ 12.5mg  q day and Procardia 90mg  XR q day. She has been taking both of these medications since going home. No medication changes. She was seen in the office today for a blood pressure check and then she was sent here. She has a headache at this time. She rates her headache 1-2/10. She took ibuprofen for this headache last time. She states that the ibuprofen didn't help, but that the headache is so mild it wasn't bothering her. She denies any visual disturbance or RUQ pain. She is breastfeeding. Next appt in the office: 03/05/2019  Blood pressure in the office today: 138/110 140/105   OB History    Gravida  1   Para  1   Term      Preterm  1   AB      Living  1     SAB      TAB      Ectopic      Multiple  0   Live Births  1           Past Medical History:  Diagnosis Date   Anemia    Hypertension    Murmur, cardiac    Pregnancy induced hypertension     Past Surgical History:  Procedure Laterality Date   NO PAST SURGERIES      Family History  Problem Relation Age of Onset   Heart disease Mother     Social History   Tobacco Use   Smoking status: Never Smoker   Smokeless tobacco: Never Used  Substance Use Topics   Alcohol use: Yes   Drug use: Never    Allergies: No Known Allergies  Medications Prior to Admission  Medication Sig Dispense Refill Last Dose   ferrous sulfate 325 (65 FE) MG tablet Take 325 mg by mouth daily with breakfast.   02/26/2019 at 1030   hydrochlorothiazide (MICROZIDE) 12.5 MG capsule Take 1 capsule (12.5 mg total) by mouth daily. 10 capsule 0  02/26/2019 at 1030   NIFEdipine (PROCARDIA XL/NIFEDICAL-XL) 90 MG 24 hr tablet Take 1 tablet (90 mg total) by mouth daily. 30 tablet 1 02/26/2019 at 1030   cyclobenzaprine (FLEXERIL) 10 MG tablet Take 10 mg by mouth 3 (three) times daily as needed for muscle spasms.   02/18/2019    Review of Systems  Constitutional: Negative for chills and fever.  Eyes: Negative for visual disturbance.  Gastrointestinal: Negative for nausea and vomiting.  Neurological: Positive for headaches.   Physical Exam   Blood pressure (!) 140/93, pulse 89, temperature 98.3 F (36.8 C), temperature source Oral, resp. rate 18, SpO2 100 %, currently breastfeeding.  Physical Exam  Nursing note and vitals reviewed. Constitutional: She is oriented to person, place, and time. She appears well-developed and well-nourished. No distress.  HENT:  Head: Normocephalic.  Cardiovascular: Normal rate.  Respiratory: Effort normal.  GI: Soft. There is no abdominal tenderness. There is no rebound.  Neurological: She is alert and oriented to person, place, and time.  Skin: Skin is warm and dry.  Psychiatric: She has a normal  mood and affect.   Results for orders placed or performed during the hospital encounter of 02/26/19 (from the past 24 hour(s))  CBC     Status: Abnormal   Collection Time: 02/26/19  1:39 PM  Result Value Ref Range   WBC 14.0 (H) 4.0 - 10.5 K/uL   RBC 4.24 3.87 - 5.11 MIL/uL   Hemoglobin 13.7 12.0 - 15.0 g/dL   HCT 39.1 36.0 - 46.0 %   MCV 92.2 80.0 - 100.0 fL   MCH 32.3 26.0 - 34.0 pg   MCHC 35.0 30.0 - 36.0 g/dL   RDW 13.0 11.5 - 15.5 %   Platelets 389 150 - 400 K/uL   nRBC 0.0 0.0 - 0.2 %  Comprehensive metabolic panel     Status: Abnormal   Collection Time: 02/26/19  1:39 PM  Result Value Ref Range   Sodium 137 135 - 145 mmol/L   Potassium 3.4 (L) 3.5 - 5.1 mmol/L   Chloride 102 98 - 111 mmol/L   CO2 24 22 - 32 mmol/L   Glucose, Bld 87 70 - 99 mg/dL   BUN 18 6 - 20 mg/dL   Creatinine, Ser  0.79 0.44 - 1.00 mg/dL   Calcium 9.3 8.9 - 10.3 mg/dL   Total Protein 6.9 6.5 - 8.1 g/dL   Albumin 3.1 (L) 3.5 - 5.0 g/dL   AST 24 15 - 41 U/L   ALT 20 0 - 44 U/L   Alkaline Phosphatase 111 38 - 126 U/L   Total Bilirubin 0.7 0.3 - 1.2 mg/dL   GFR calc non Af Amer >60 >60 mL/min   GFR calc Af Amer >60 >60 mL/min   Anion gap 11 5 - 15   Patient Vitals for the past 24 hrs:  BP Temp Temp src Pulse Resp SpO2  02/26/19 1444 (!) 150/96 -- -- 87 -- --  02/26/19 1430 135/90 -- -- 85 -- --  02/26/19 1415 (!) 146/92 -- -- 87 -- --  02/26/19 1400 132/88 -- -- 89 -- --  02/26/19 1345 (!) 138/98 -- -- 90 -- --  02/26/19 1330 (!) 140/93 -- -- 89 -- --  02/26/19 1322 (!) 143/101 -- -- 92 -- --  02/26/19 1301 (!) 141/106 98.3 F (36.8 C) Oral (!) 114 18 100 %     MAU Course  Procedures  MDM 1348: DW Dr. Harolyn Rutherford, will increase HCTZ to 25mg , continue procardia as already prescribed. Patient to keep FU in the office as planned. No severe range blood pressures while here. Labs normal. No severe features and patient has had magnesium previously.   Patient has had Fioricet and her headache has improved.   Assessment and Plan   1. Pre-eclampsia in puerperium   2. Postpartum state    DC home Pre-eclampsia warning signs  RX: HCTZ 25mg  QD #30 with 1 RF  Return to MAU as needed FU with OB as planned  Follow-up Information    Gynecology, [place] Follow up.   Specialty:  Obstetrics and Gynecology Contact information: [address] [place] [address] [place] DNP, CNM  02/26/19  2:59 PM

## 2019-02-26 NOTE — Discharge Instructions (Signed)
.  Preeclampsia and Eclampsia    Preeclampsia is a serious condition that may develop during pregnancy. It is also called toxemia of pregnancy. This condition causes high blood pressure along with other symptoms, such as swelling and headaches. These symptoms may develop as the condition gets worse. Preeclampsia may occur at 20 weeks of pregnancy or later.  Diagnosing and treating preeclampsia early is very important. If not treated early, it can cause serious problems for you and your baby. One problem it can lead to is eclampsia. Eclampsia is a condition that causes muscle jerking or shaking (convulsions or seizures) and other serious problems for the mother. During pregnancy, delivering your baby may be the best treatment for preeclampsia or eclampsia. For most women, preeclampsia and eclampsia symptoms go away after giving birth.  In rare cases, a woman may develop preeclampsia after giving birth (postpartum preeclampsia). This usually occurs within 48 hours after childbirth but may occur up to 6 weeks after giving birth.  What are the causes?  The cause of preeclampsia is not known.  What increases the risk?  The following risk factors make you more likely to develop preeclampsia:   Being pregnant for the first time.   Having had preeclampsia during a past pregnancy.   Having a family history of preeclampsia.   Having high blood pressure.   Being pregnant with more than one baby.   Being 35 or older.   Being African-American.   Having kidney disease or diabetes.   Having medical conditions such as lupus or blood diseases.   Being very overweight (obese).  What are the signs or symptoms?  The earliest signs of preeclampsia are:   High blood pressure.   Increased protein in your urine. Your health care provider will check for this at every visit before you give birth (prenatal visit).  Other symptoms that may develop as the condition gets worse include:   Severe headaches.   Sudden weight  gain.   Swelling of the hands, face, legs, and feet.   Nausea and vomiting.   Vision problems, such as blurred or double vision.   Numbness in the face, arms, legs, and feet.   Urinating less than usual.   Dizziness.   Slurred speech.   Abdominal pain, especially upper abdominal pain.   Convulsions or seizures.  How is this diagnosed?  There are no screening tests for preeclampsia. Your health care provider will ask you about symptoms and check for signs of preeclampsia during your prenatal visits. You may also have tests that include:   Urine tests.   Blood tests.   Checking your blood pressure.   Monitoring your baby's heart rate.   Ultrasound.  How is this treated?  You and your health care provider will determine the treatment approach that is best for you. Treatment may include:   Having more frequent prenatal exams to check for signs of preeclampsia, if you have an increased risk for preeclampsia.   Medicine to lower your blood pressure.   Staying in the hospital, if your condition is severe. There, treatment will focus on controlling your blood pressure and the amount of fluids in your body (fluid retention).   Taking medicine (magnesium sulfate) to prevent seizures. This may be given as an injection or through an IV.   Taking a low-dose aspirin during your pregnancy.   Delivering your baby early, if your condition gets worse. You may have your labor started with medicine (induced), or you may have a cesarean   delivery.  Follow these instructions at home:  Eating and drinking     Drink enough fluid to keep your urine pale yellow.   Avoid caffeine.  Lifestyle   Do not use any products that contain nicotine or tobacco, such as cigarettes and e-cigarettes. If you need help quitting, ask your health care provider.   Do not use alcohol or drugs.   Avoid stress as much as possible. Rest and get plenty of sleep.  General instructions   Take over-the-counter and prescription medicines only as  told by your health care provider.   When lying down, lie on your left side. This keeps pressure off your major blood vessels.   When sitting or lying down, raise (elevate) your feet. Try putting some pillows underneath your lower legs.   Exercise regularly. Ask your health care provider what kinds of exercise are best for you.   Keep all follow-up and prenatal visits as told by your health care provider. This is important.  How is this prevented?  There is no known way of preventing preeclampsia or eclampsia from developing. However, to lower your risk of complications and detect problems early:   Get regular prenatal care. Your health care provider may be able to diagnose and treat the condition early.   Maintain a healthy weight. Ask your health care provider for help managing weight gain during pregnancy.   Work with your health care provider to manage any long-term (chronic) health conditions you have, such as diabetes or kidney problems.   You may have tests of your blood pressure and kidney function after giving birth.   Your health care provider may have you take low-dose aspirin during your next pregnancy.  Contact a health care provider if:   You have symptoms that your health care provider told you may require more treatment or monitoring, such as:  ? Headaches.  ? Nausea or vomiting.  ? Abdominal pain.  ? Dizziness.  ? Light-headedness.  Get help right away if:   You have severe:  ? Abdominal pain.  ? Headaches that do not get better.  ? Dizziness.  ? Vision problems.  ? Confusion.  ? Nausea or vomiting.   You have any of the following:  ? A seizure.  ? Sudden, rapid weight gain.  ? Sudden swelling in your hands, ankles, or face.  ? Trouble moving any part of your body.  ? Numbness in any part of your body.  ? Trouble speaking.  ? Abnormal bleeding.   You faint.  Summary   Preeclampsia is a serious condition that may develop during pregnancy. It is also called toxemia of pregnancy.   This  condition causes high blood pressure along with other symptoms, such as swelling and headaches.   Diagnosing and treating preeclampsia early is very important. If not treated early, it can cause serious problems for you and your baby.   Get help right away if you have symptoms that your health care provider told you to watch for.  This information is not intended to replace advice given to you by your health care provider. Make sure you discuss any questions you have with your health care provider.  Document Released: 09/02/2000 Document Revised: 08/22/2017 Document Reviewed: 04/11/2016  Elsevier Interactive Patient Education  2019 [employer]

## 2019-02-26 NOTE — MAU Note (Signed)
Had severe Pre-e, vag del 6/3. dc'd on Sat.  Had f/u today, 138/110 was sent home on BP med.  Slight HA, some blurring, denies epigastric pain or swelling.

## 2019-10-02 ENCOUNTER — Other Ambulatory Visit: Payer: Self-pay

## 2019-10-02 ENCOUNTER — Ambulatory Visit: Payer: Managed Care, Other (non HMO) | Attending: Internal Medicine

## 2021-03-06 IMAGING — MR MRI HEAD WITHOUT CONTRAST
8 of 10 series · 35 of 48 positions shown · non-contrast
Comparison: Head CT 11/19/2015.

CLINICAL DATA: 33-year-old pregnant female with 2 weeks of
headaches, hypertensive, sudden loss of vision. Query PRES.

EXAM:
MRI HEAD WITHOUT CONTRAST
TECHNIQUE: Multiplanar, multiecho pulse sequences of the brain and surrounding
structures were obtained without intravenous contrast.

[Series 3: DWI · axial · 3.0mm · 1.09mm/px · z∈[-58,+68]mm · 9 of 86 slices shown (1 of 4)]
[im 1/86]
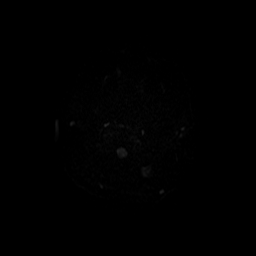
[im 11/86]
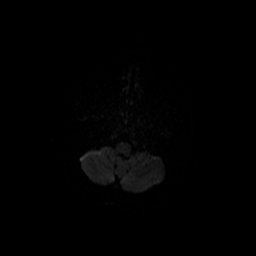
[im 22/86]
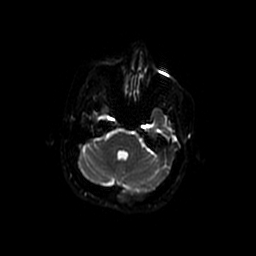
[im 32/86]
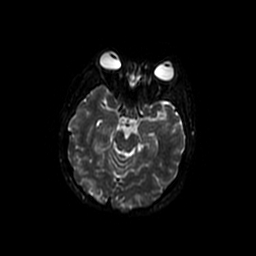
[im 43/86]
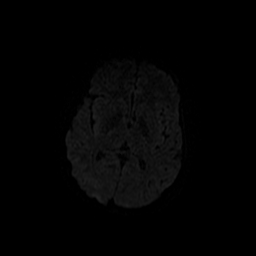
[im 54/86]
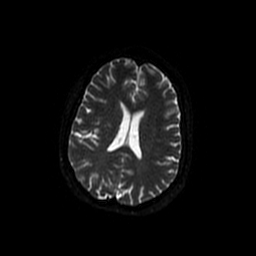
[im 64/86]
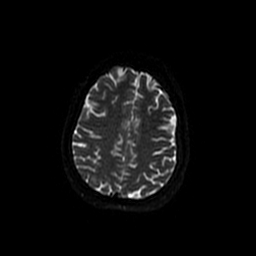
[im 75/86]
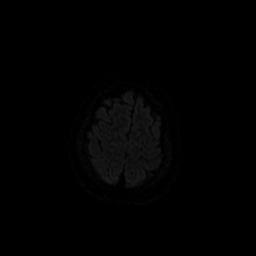
[im 86/86]
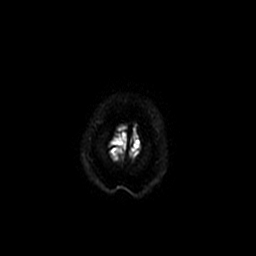

[Series 4: DWI · coronal · 5.0mm · 1.09mm/px · 6 of 64 slices shown (2 of 4)]
[im 1/64]
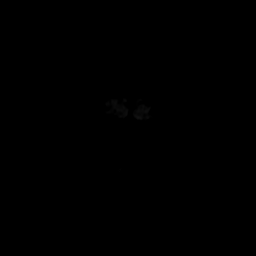
[im 13/64]
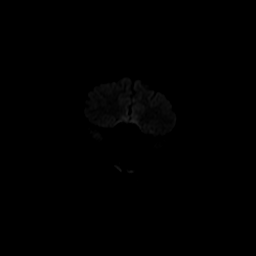
[im 26/64]
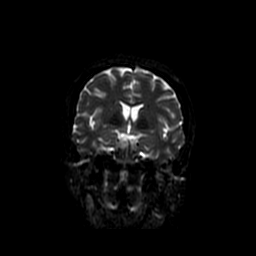
[im 38/64]
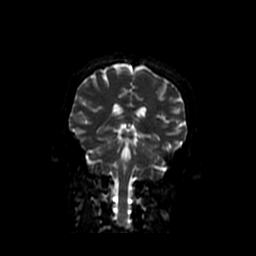
[im 51/64]
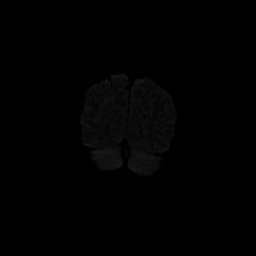
[im 64/64]
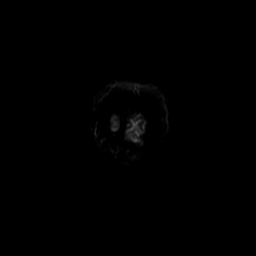

[Series 5: FLAIR · axial · 3.0mm · 0.43mm/px · z∈[-64,+66]mm · 2 of 23 slices shown]
[im 1/23]
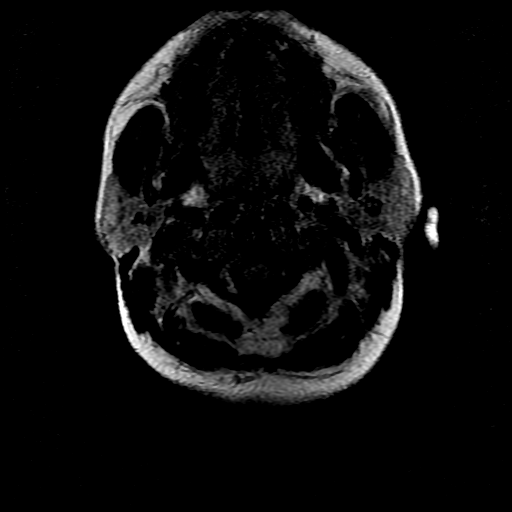
[im 23/23]
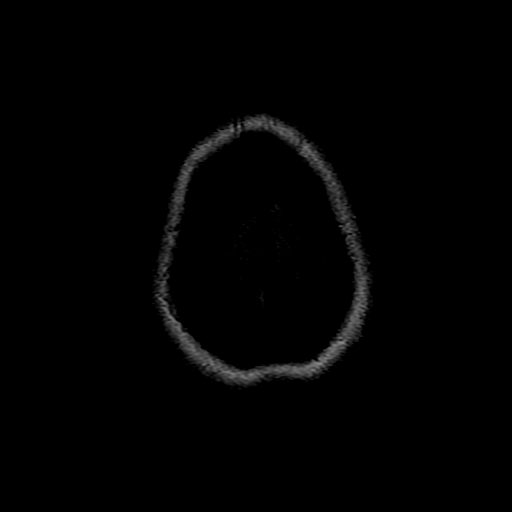

[Series 6: T2 · axial · 5.0mm · 0.43mm/px · z∈[-64,+66]mm · 3 of 23 slices shown (1 of 2)]
[im 1/23]
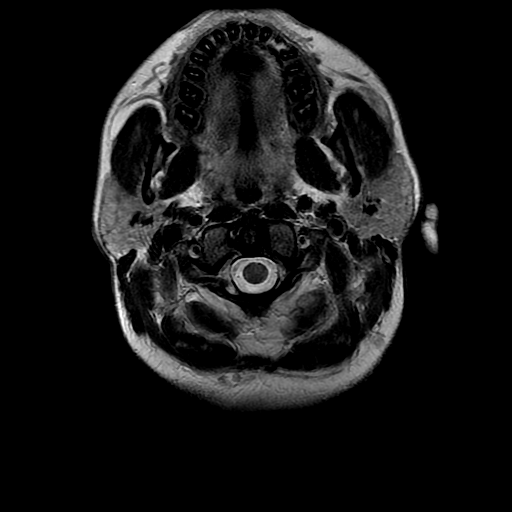
[im 12/23]
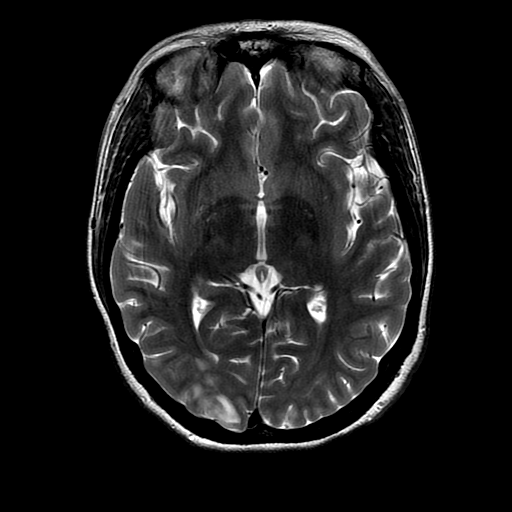
[im 23/23]
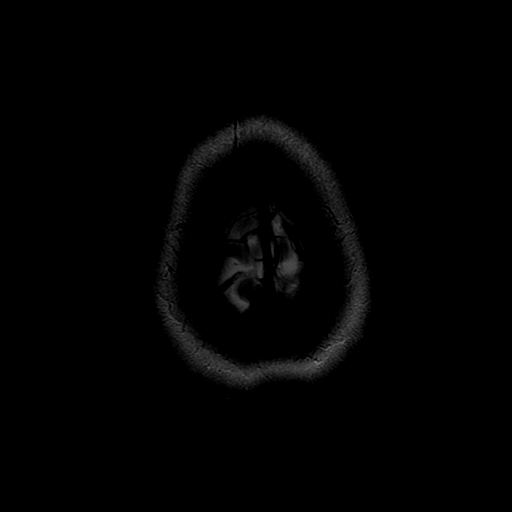

[Series 8: T2 · coronal · 5.0mm · 0.39mm/px · 3 of 24 slices shown (2 of 2)]
[im 1/24]
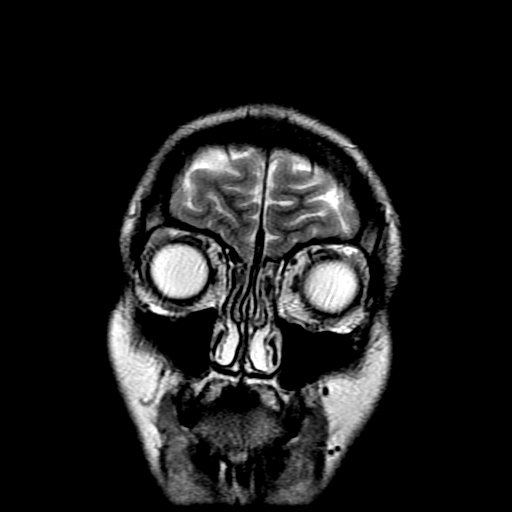
[im 12/24]
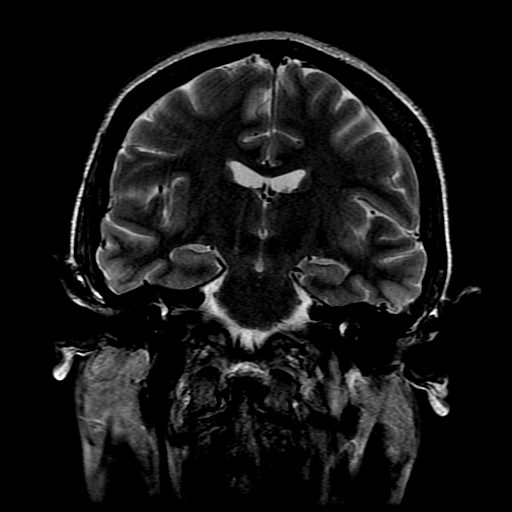
[im 24/24]
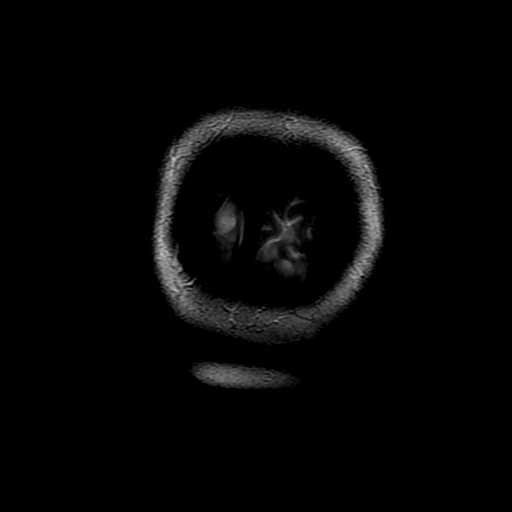

[Series 9: T1 · sagittal · 5.0mm · 0.47mm/px · 3 of 23 slices shown]
[im 1/23]
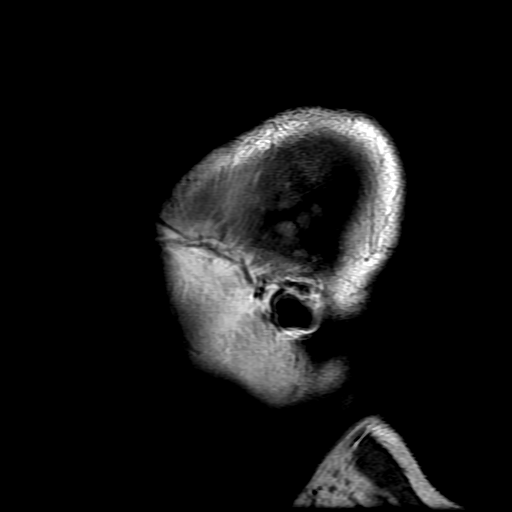
[im 12/23]
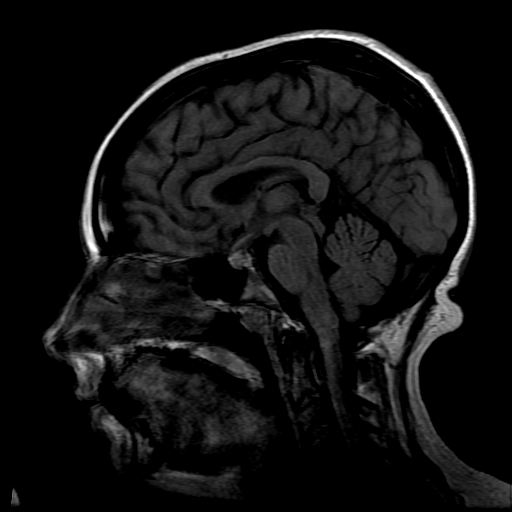
[im 23/23]
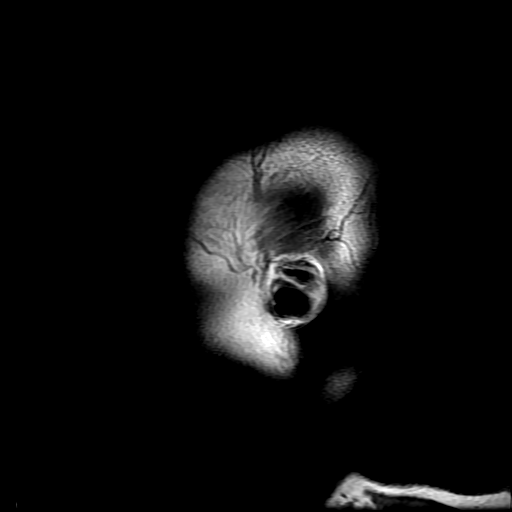

[Series 300: DWI · axial · 3.0mm · 1.09mm/px · z∈[-58,+68]mm · 5 of 43 slices shown (3 of 4)]
[im 1/43]
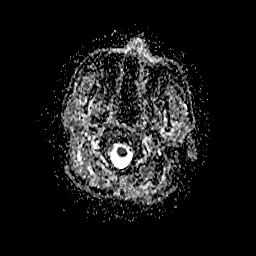
[im 11/43]
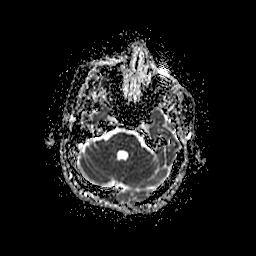
[im 22/43]
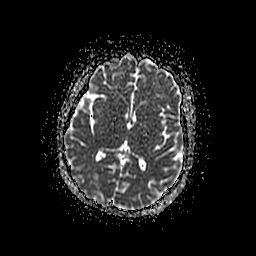
[im 32/43]
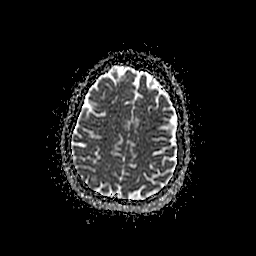
[im 43/43]
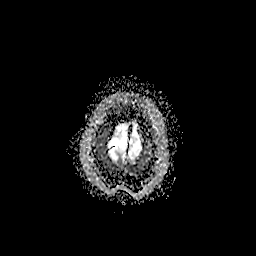

[Series 400: DWI · coronal · 5.0mm · 1.09mm/px · 4 of 32 slices shown (4 of 4)]
[im 1/32]
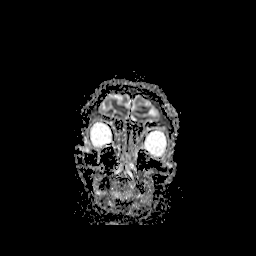
[im 11/32]
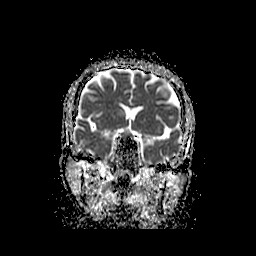
[im 21/32]
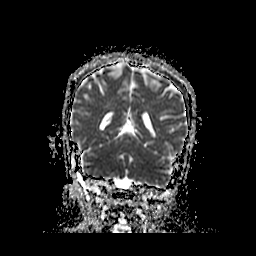
[im 32/32]
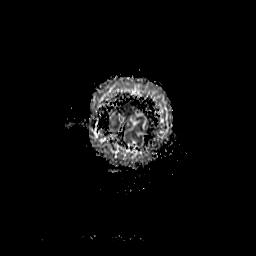

[35 of 48 positions shown; findings below may reference images not displayed]

FINDINGS: Brain: There is abnormal patchy T2 and FLAIR hyperintensity
affecting gray and white matter in both occipital lobes (series 5,
image 10), the right parietal lobe (image 17), and also the right
caudate and corona radiata (image 14). The other deep gray matter,
the brainstem and cerebellum are spared. These areas demonstrate
primarily facilitated diffusion. There is no associated hemorrhage
or mass effect.

Background gray and white matter signal appears normal. No chronic
encephalomalacia or chronic blood products identified. Normal
cerebral volume. No restricted diffusion. No midline shift, mass
effect, ventriculomegaly, extra-axial collection or acute
intracranial hemorrhage. Cervicomedullary junction within normal
limits. The pituitary is mildly prominent but normal for the female
pregnant state. The suprasellar cistern remains patent.

Vascular: Major intracranial vascular flow voids are preserved.

Skull and upper cervical spine: Negative visible cervical spine.
Visualized bone marrow signal is within normal limits.

Sinuses/Orbits: Negative.

Other: Mastoids are well pneumatized. Grossly normal visible
internal auditory structures. Scalp and face soft tissues appear
negative.
IMPRESSION: Positive for acute posterior reversible encephalopathy syndrome
(PRES), with symmetric occipital but also right parietal and even
right caudate involvement. No associated hemorrhage or infarct
identified.

## 2021-06-18 ENCOUNTER — Emergency Department (HOSPITAL_COMMUNITY)
Admission: EM | Admit: 2021-06-18 | Discharge: 2021-06-18 | Disposition: A | Discharge status: Home / Self Care | Payer: Managed Care, Other (non HMO) | Attending: Emergency Medicine | Admitting: Emergency Medicine

## 2021-06-18 ENCOUNTER — Other Ambulatory Visit: Payer: Self-pay

## 2021-06-18 ENCOUNTER — Emergency Department (HOSPITAL_COMMUNITY): Payer: Managed Care, Other (non HMO)

## 2021-06-18 DIAGNOSIS — R059 Cough, unspecified: Secondary | ICD-10-CM | POA: Insufficient documentation

## 2021-06-18 DIAGNOSIS — R1084 Generalized abdominal pain: Secondary | ICD-10-CM | POA: Diagnosis not present

## 2021-06-18 DIAGNOSIS — R112 Nausea with vomiting, unspecified: Secondary | ICD-10-CM | POA: Diagnosis present

## 2021-06-18 DIAGNOSIS — D1803 Hemangioma of intra-abdominal structures: Secondary | ICD-10-CM

## 2021-06-18 DIAGNOSIS — D72829 Elevated white blood cell count, unspecified: Secondary | ICD-10-CM | POA: Diagnosis not present

## 2021-06-18 DIAGNOSIS — D1809 Hemangioma of other sites: Secondary | ICD-10-CM | POA: Diagnosis not present

## 2021-06-18 LAB — COMPREHENSIVE METABOLIC PANEL
ALT: 26 U/L (ref 0–44)
AST: 22 U/L (ref 15–41)
Albumin: 4 g/dL (ref 3.5–5.0)
Alkaline Phosphatase: 84 U/L (ref 38–126)
Anion gap: 12 (ref 5–15)
BUN: 11 mg/dL (ref 6–20)
CO2: 26 mmol/L (ref 22–32)
Calcium: 9.9 mg/dL (ref 8.9–10.3)
Chloride: 99 mmol/L (ref 98–111)
Creatinine, Ser: 1.06 mg/dL — ABNORMAL HIGH (ref 0.44–1.00)
GFR, Estimated: >60 mL/min (ref >60)
Glucose, Bld: 106 mg/dL — ABNORMAL HIGH (ref 70–99)
Potassium: 3.8 mmol/L (ref 3.5–5.1)
Sodium: 137 mmol/L (ref 135–145)
Total Bilirubin: 0.9 mg/dL (ref 0.3–1.2)
Total Protein: 7.4 g/dL (ref 6.5–8.1)

## 2021-06-18 LAB — URINALYSIS, ROUTINE W REFLEX MICROSCOPIC
Bilirubin Urine: NEGATIVE
Glucose, UA: NEGATIVE mg/dL
Hgb urine dipstick: NEGATIVE
Ketones, ur: 20 mg/dL — AB
Nitrite: NEGATIVE
Protein, ur: 100 mg/dL — AB
Specific Gravity, Urine: 1.018 (ref 1.005–1.030)
pH: 7 (ref 5.0–8.0)

## 2021-06-18 LAB — CBC
HCT: 44.6 % (ref 36.0–46.0)
Hemoglobin: 15.1 g/dL — ABNORMAL HIGH (ref 12.0–15.0)
MCH: 30.6 pg (ref 26.0–34.0)
MCHC: 33.9 g/dL (ref 30.0–36.0)
MCV: 90.3 fL (ref 80.0–100.0)
Platelets: 383 10*3/uL (ref 150–400)
RBC: 4.94 MIL/uL (ref 3.87–5.11)
RDW: 12.6 % (ref 11.5–15.5)
WBC: 12 10*3/uL — ABNORMAL HIGH (ref 4.0–10.5)
nRBC: 0 % (ref 0.0–0.2)

## 2021-06-18 LAB — I-STAT BETA HCG BLOOD, ED (MC, WL, AP ONLY): I-stat hCG, quantitative: <5 m[IU]/mL (ref <5)

## 2021-06-18 LAB — POC OCCULT BLOOD, ED: Fecal Occult Bld: NEGATIVE

## 2021-06-18 LAB — LIPASE, BLOOD: Lipase: 38 U/L (ref 11–51)

## 2021-06-18 LAB — TROPONIN I (HIGH SENSITIVITY): Troponin I (High Sensitivity): 3 ng/L (ref <18)

## 2021-06-18 MED ORDER — ONDANSETRON 4 MG PO TBDP
4.0000 mg | ORAL_TABLET | Freq: Once | ORAL | Status: AC | PRN
  Administered 2021-06-18: 4 mg via ORAL
  Filled 2021-06-18: qty 1

## 2021-06-18 MED ORDER — ONDANSETRON 4 MG PO TBDP: 4.0000 mg | ORAL_TABLET | Freq: Three times a day (TID) | ORAL | 0 refills | Status: AC | PRN

## 2021-06-18 MED ORDER — ACETAMINOPHEN 325 MG PO TABS
650.0000 mg | ORAL_TABLET | Freq: Once | ORAL | Status: AC | PRN
  Administered 2021-06-18: 650 mg via ORAL
  Filled 2021-06-18: qty 2

## 2021-06-18 MED ORDER — IOHEXOL 350 MG/ML SOLN
100.0000 mL | Freq: Once | INTRAVENOUS | Status: AC | PRN
  Administered 2021-06-18: 100 mL via INTRAVENOUS

## 2021-06-18 MED ORDER — SODIUM CHLORIDE 0.9 % IV BOLUS
1000.0000 mL | Freq: Once | INTRAVENOUS | Status: AC
  Administered 2021-06-18: 1000 mL via INTRAVENOUS

## 2021-06-18 NOTE — Discharge Instructions (Addendum)
Please pick up nausea medication and take as needed  Drink plenty of fluids to stay hydrated; rest as much as possible.   Follow up with your PCP regarding ED visit today. Your CT scan did show an incidental finding of inflammation along the abdominal musculature of the lower right side; the radiologist recommended an outpatient MRI pelvis with and without contrast for further eval. Your PCP can order this.   Keep appointment with GI as scheduled for further evaluation of your 1 episode of black emesis.   Return to the ED for any new/worsening symptoms

## 2021-06-18 NOTE — ED Provider Notes (Signed)
[facility] Provider Note   CSN: [id number] Arrival date & time: 06/18/21  4132     History Chief Complaint  Patient presents with   Hematemesis    Ashley Santos is a 35 y.o. female who presents to the ED today with complaint of 1 episode of coffee-ground emesis that occurred around 3 AM this morning.  Patient reports that for the last 2 weeks she has had a intermittent dry/productive cough.  She also complains of diffuse abdominal pain.  She states that for the past 2 days she has had nonbloody nonbilious emesis.  States she vomited anytime she ate.  She states that this morning around 3 AM she woke up to vomit and noticed that her emesis looked like round of coffee beans/black and chunky in color.  She states that she googled her symptoms and was advised to come to the ED for further evaluation.  She has not had any more emesis since then however has not tried to eat anything.  She does mention that she suffers from frequent headaches and typically takes about 400 mg ibuprofen and 2 over-the-counter Excedrin daily if not every other day however has not taken them in about a week.  She denies heavy alcohol use; occasionally drinks alcohol on the weekends socially.  She denies any change in color of her stool including bright red blood or melena.  She is not on a blood thinner.  She has no other complaints at this time.  Per triage report patient was complaining of chest pain however she states she think it is likely related to her excess coughing.  The history is provided by the patient and medical records.      No past medical history on file.  There are no problems to display for this patient.   OB History   No obstetric history on file.     No family history on file.     Home Medications Prior to Admission medications   Medication Sig Start Date End Date Taking? Authorizing Provider  ondansetron (ZOFRAN ODT) 4 MG disintegrating  tablet Take 1 tablet (4 mg total) by mouth every 8 (eight) hours as needed for nausea or vomiting. 06/18/21  Yes Eustaquio Maize, PA-C    Allergies    Patient has no allergy information on record.  Review of Systems   Review of Systems  Constitutional:  Negative for chills and fever.  Respiratory:  Positive for cough.   Gastrointestinal:  Positive for abdominal pain, nausea and vomiting. Negative for blood in stool, constipation and diarrhea.  All other systems reviewed and are negative.  Physical Exam Updated Vital Signs BP 104/81   Pulse 76   Temp 98.3 F (36.8 C) (Oral)   Resp 16   Ht 5' (1.524 m)   Wt 81.6 kg   LMP 05/24/2021 (Within Days)   SpO2 99%   BMI 35.15 kg/m   Physical Exam Vitals and nursing note reviewed.  Constitutional:      Appearance: She is not ill-appearing or diaphoretic.  HENT:     Head: Normocephalic and atraumatic.     Mouth/Throat:     Pharynx: Oropharynx is clear.  Eyes:     Conjunctiva/sclera: Conjunctivae normal.  Cardiovascular:     Rate and Rhythm: Normal rate and regular rhythm.     Pulses: Normal pulses.  Pulmonary:     Effort: Pulmonary effort is normal.     Breath sounds: Normal breath sounds. No wheezing, rhonchi  or rales.  Chest:     Chest wall: No tenderness.  Abdominal:     Palpations: Abdomen is soft.     Tenderness: There is abdominal tenderness. There is no guarding or rebound.     Comments: Soft, mild diffuse abdominal TTP, +BS throughout, no r/g/r, neg murphy's, neg mcburney's, no CVA TTP  Musculoskeletal:     Cervical back: Normal range of motion and neck supple.  Skin:    General: Skin is warm and dry.  Neurological:     Mental Status: She is alert.    ED Results / Procedures / Treatments   Labs (all labs ordered are listed, but only abnormal results are displayed) Labs Reviewed  COMPREHENSIVE METABOLIC PANEL - Abnormal; Notable for the following components:      Result Value   Glucose, Bld 106 (*)     Creatinine, Ser 1.06 (*)    All other components within normal limits  CBC - Abnormal; Notable for the following components:   WBC 12.0 (*)    Hemoglobin 15.1 (*)    All other components within normal limits  URINALYSIS, ROUTINE W REFLEX MICROSCOPIC - Abnormal; Notable for the following components:   Color, Urine AMBER (*)    APPearance CLOUDY (*)    Ketones, ur 20 (*)    Protein, ur 100 (*)    Leukocytes,Ua TRACE (*)    Bacteria, UA FEW (*)    All other components within normal limits  LIPASE, BLOOD  I-STAT BETA HCG BLOOD, ED ([facility], [facility], [facility] ONLY)  POC OCCULT BLOOD, ED  TROPONIN I (HIGH SENSITIVITY)    EKG EKG Interpretation  Date/Time:  Friday June 18 2021 08:06:39 EDT Ventricular Rate:  91 PR Interval:  146 QRS Duration: 84 QT Interval:  340 QTC Calculation: 418 R Axis:   -41 Text Interpretation: Normal sinus rhythm Left axis deviation Abnormal ECG Confirmed by Pattricia Boss [phone]) on 06/18/2021 10:20:05 AM  Radiology DG Chest 2 View  Result Date: 06/18/2021 CLINICAL DATA:  Chest pain EXAM: CHEST - 2 VIEW COMPARISON:  None. FINDINGS: The heart size and mediastinal contours are within normal limits. Both lungs are clear. No pleural effusion or pneumothorax. The visualized skeletal structures are unremarkable. IMPRESSION: No acute process in the chest. Electronically Signed   By: Macy Mis M.D.   On: 06/18/2021 09:06   CT Abdomen Pelvis W Contrast  Result Date: 06/18/2021 CLINICAL DATA:  Abdominal pain, acute, nonlocalized EXAM: CT ABDOMEN AND PELVIS WITH CONTRAST TECHNIQUE: Multidetector CT imaging of the abdomen and pelvis was performed using the standard protocol following bolus administration of intravenous contrast. CONTRAST:  162mL OMNIPAQUE IOHEXOL 350 MG/ML SOLN COMPARISON:  None. FINDINGS: Lower chest: Included lung bases are clear.  Heart size is normal. Hepatobiliary: 11 mm low-density lesion within the right hepatic lobe with discontinuous peripheral nodular  enhancement, most likely representing a small hepatic hemangioma (series 6, images 52-54). Liver appears otherwise unremarkable. Normal appearance of the gallbladder. No hyperdense gallstone. No biliary dilatation. Pancreas: Unremarkable. No pancreatic ductal dilatation or surrounding inflammatory changes. Spleen: Normal in size without focal abnormality. Adrenals/Urinary Tract: Unremarkable adrenal glands. Tiny 1-2 mm nonobstructing stone within the lower pole of the left kidney. Kidneys appear otherwise unremarkable. No focal renal lesion. No right-sided renal stone. No hydronephrosis. Ureters are unremarkable. Urinary bladder is decompressed, limiting its evaluation. Stomach/Bowel: Small hiatal hernia. Stomach is otherwise within normal limits. Appendix appears normal (series 3, image 57). No evidence of bowel wall thickening, distention, or inflammatory changes. Vascular/Lymphatic:  No significant vascular findings are present. No enlarged abdominal or pelvic lymph nodes. Reproductive: Uterus and bilateral adnexa are unremarkable. Other: No free fluid. No abdominopelvic fluid collection. No pneumoperitoneum. No abdominal wall hernia. Musculoskeletal: There is a focal area of relatively increased attenuation within the medial aspect of the lower right rectus abdominus muscle measuring approximately 2.5 x 1.2 x 1.6 cm (series 3, image 74). Osseous structures are within normal limits. No acute findings. IMPRESSION: 1. No acute abdominopelvic findings. Normal appendix. 2. Focal area of relatively increased attenuation within the medial aspect of the lower right rectus abdominus muscle measuring up to 2.5 cm. Findings are nonspecific with differential including sequela of prior hematoma (particularly in the setting of prior surgery such as a cesarean section), ectopic endometrial implant, or possibly desmoid tumor. Further evaluation with nonemergent MRI of the pelvis without and with IV contrast is recommended. 3.  Tiny 1-2 mm nonobstructing stone within the lower pole of the left kidney. 4. Small hiatal hernia. 5. 11 mm low-density lesion within the right hepatic lobe with discontinuous peripheral nodular enhancement, most likely representing a small hepatic hemangioma. No dedicated follow-up is recommended. Electronically Signed   By: Davina Poke D.O.   On: 06/18/2021 15:08    Procedures Procedures   Medications Ordered in ED Medications  ondansetron (ZOFRAN-ODT) disintegrating tablet 4 mg (4 mg Oral Given 06/18/21 0823)  acetaminophen (TYLENOL) tablet 650 mg (650 mg Oral Given 06/18/21 0823)  sodium chloride 0.9 % bolus 1,000 mL (1,000 mLs Intravenous New Bag/Given 06/18/21 1301)  iohexol (OMNIPAQUE) 350 MG/ML injection 100 mL (100 mLs Intravenous Contrast Given 06/18/21 1430)    ED Course  I have reviewed the triage vital signs and the nursing notes.  Pertinent labs & imaging results that were available during my care of the patient were reviewed by me and considered in my medical decision making (see chart for details).    MDM Rules/Calculators/A&P                           35 year old female who presents to the ED today with complaint of coffee-ground emesis x1.  Has been having abdominal pain, cough, chest pain for the past 2 weeks with emesis for the past 2 days.  On arrival to the ED vitals are stable.  Blood pressure 117/88.  She had lab work done while in the waiting room including a CBC, CMP, lipase, urinalysis, beta-hCG, EKG, chest x-ray.  Does have a mild leukocytosis at 12,000 however hemoglobin slightly elevated as well, question dehydration.  Her lipase is within normal limits at 38.  CMP with slight elevation in creatinine at 1.06 however do not have previous to compare to.  No elevation in LFTs.  No other electrolyte abnormalities.  Urinalysis does show few bacteria, 11-20 squamous epithelial cells as well as hyaline casts and amorphous crystals.  She denies any flank pain.  Beta-hCG  is negative.  Chest x-ray is clear.  On my exam she has diffuse abdominal tenderness palpation without rebound or guarding.  She is actively coughing in the room however speaking in full sentences without difficulty.  She has not had any more emesis since her 1 episode of coffee-ground emesis.  We will plan for rectal exam at this time to check for melena however patient denies same.  Given abdominal tenderness palpation we will plan for CT scan.  Given chest pain we will plan for troponin however again do likely suspect is likely  related to costochondritis given her cough.  Given her cough has been present for 2 weeks with a negative chest x-ray for pneumonia do not feel she needs COVID testing at this time as she would be out of the window for any quarantine.  If work-up unremarkable and patient able to tolerate fluids without any more emesis/coffee-ground emesis we will plan to discharge.  Have discussed case with attending physician Dr. Vallery Ridge who agrees with plan.   Rectal exam with light brown stool on gloved finger. Guaiac negative.   CT: IMPRESSION:  1. No acute abdominopelvic findings. Normal appendix.  2. Focal area of relatively increased attenuation within the medial  aspect of the lower right rectus abdominus muscle measuring up to  2.5 cm. Findings are nonspecific with differential including sequela  of prior hematoma (particularly in the setting of prior surgery such  as a cesarean section), ectopic endometrial implant, or possibly  desmoid tumor. Further evaluation with nonemergent MRI of the pelvis  without and with IV contrast is recommended.  3. Tiny 1-2 mm nonobstructing stone within the lower pole of the  left kidney.  4. Small hiatal hernia.  5. 11 mm low-density lesion within the right hepatic lobe with  discontinuous peripheral nodular enhancement, most likely  representing a small hepatic hemangioma. No dedicated follow-up is  recommended.   Troponin of 3; do not feel  she requires repeat as I have very low suspicion for ACS  Pt able to tolerate fluids and crackers in the ED. Will discharge pt home at this time with PCP follow up. I had lengthy discussion with her regarding incidental findings on CT scan. She is stable for discharge at this time.   This note was prepared using Dragon voice recognition software and may include unintentional dictation errors due to the inherent limitations of voice recognition software.    Final Clinical Impression(s) / ED Diagnoses Final diagnoses:  Generalized abdominal pain  Non-intractable vomiting with nausea, unspecified vomiting type  Hepatic hemangioma    Rx / DC Orders ED Discharge Orders          Ordered    ondansetron (ZOFRAN ODT) 4 MG disintegrating tablet  Every 8 hours PRN        06/18/21 1606             Discharge Instructions      Please pick up nausea medication and take as needed  Drink plenty of fluids to stay hydrated; rest as much as possible.   Follow up with your PCP regarding ED visit today. Your CT scan did show an incidental finding of inflammation along the abdominal musculature of the lower right side; the radiologist recommended an outpatient MRI pelvis with and without contrast for further eval. Your PCP can order this.   Keep appointment with GI as scheduled for further evaluation of your 1 episode of black emesis.   Return to the ED for any new/worsening symptoms       Eustaquio Maize, Hershal Coria 06/18/21 1613    Charlesetta Shanks, MD 06/20/21 [phone]

## 2021-06-18 NOTE — ED Notes (Signed)
Pt in bed, pt denies pain, po challenge, pt ambulatory to the bathroom

## 2021-06-18 NOTE — ED Notes (Signed)
Pt in bed, pt denies pain, pt states that she is ready to go home

## 2021-06-18 NOTE — ED Triage Notes (Signed)
Pt here POV d/t N/V X2 days. Denies fever. Pt states when she vomited around 0400 it was like dark ground coffee. Pt endorsed chest pain. 3/10 A/OX4

## 2021-07-19 ENCOUNTER — Encounter (HOSPITAL_COMMUNITY): Payer: Self-pay | Admitting: Radiology

## 2022-05-28 ENCOUNTER — Other Ambulatory Visit: Payer: Self-pay

## 2022-05-28 ENCOUNTER — Emergency Department (HOSPITAL_COMMUNITY)
Admission: EM | Admit: 2022-05-28 | Discharge: 2022-05-28 | Disposition: A | Discharge status: Home / Self Care | Payer: Managed Care, Other (non HMO) | Attending: Emergency Medicine | Admitting: Emergency Medicine

## 2022-05-28 ENCOUNTER — Encounter (HOSPITAL_COMMUNITY): Payer: Self-pay

## 2022-05-28 DIAGNOSIS — R109 Unspecified abdominal pain: Secondary | ICD-10-CM | POA: Diagnosis not present

## 2022-05-28 DIAGNOSIS — Z20822 Contact with and (suspected) exposure to covid-19: Secondary | ICD-10-CM | POA: Diagnosis not present

## 2022-05-28 DIAGNOSIS — E860 Dehydration: Secondary | ICD-10-CM | POA: Insufficient documentation

## 2022-05-28 DIAGNOSIS — R112 Nausea with vomiting, unspecified: Secondary | ICD-10-CM | POA: Diagnosis present

## 2022-05-28 DIAGNOSIS — R824 Acetonuria: Secondary | ICD-10-CM | POA: Insufficient documentation

## 2022-05-28 DIAGNOSIS — R197 Diarrhea, unspecified: Secondary | ICD-10-CM | POA: Diagnosis not present

## 2022-05-28 DIAGNOSIS — R809 Proteinuria, unspecified: Secondary | ICD-10-CM | POA: Diagnosis not present

## 2022-05-28 LAB — URINALYSIS, ROUTINE W REFLEX MICROSCOPIC
Glucose, UA: 100 mg/dL — AB
Ketones, ur: 40 mg/dL — AB
Leukocytes,Ua: NEGATIVE
Nitrite: POSITIVE — AB
Protein, ur: 100 mg/dL — AB
Specific Gravity, Urine: >1.03 — ABNORMAL HIGH (ref 1.005–1.030)
pH: 5 (ref 5.0–8.0)

## 2022-05-28 LAB — LIPASE, BLOOD: Lipase: 34 U/L (ref 11–51)

## 2022-05-28 LAB — COMPREHENSIVE METABOLIC PANEL
ALT: 12 U/L (ref 0–44)
AST: 16 U/L (ref 15–41)
Albumin: 4 g/dL (ref 3.5–5.0)
Alkaline Phosphatase: 82 U/L (ref 38–126)
Anion gap: 8 (ref 5–15)
BUN: 16 mg/dL (ref 6–20)
CO2: 27 mmol/L (ref 22–32)
Calcium: 9.5 mg/dL (ref 8.9–10.3)
Chloride: 104 mmol/L (ref 98–111)
Creatinine, Ser: 0.84 mg/dL (ref 0.44–1.00)
GFR, Estimated: >60 mL/min (ref >60)
Glucose, Bld: 145 mg/dL — ABNORMAL HIGH (ref 70–99)
Potassium: 4.2 mmol/L (ref 3.5–5.1)
Sodium: 139 mmol/L (ref 135–145)
Total Bilirubin: 0.8 mg/dL (ref 0.3–1.2)
Total Protein: 6.9 g/dL (ref 6.5–8.1)

## 2022-05-28 LAB — CBC
HCT: 44.2 % (ref 36.0–46.0)
Hemoglobin: 15.1 g/dL — ABNORMAL HIGH (ref 12.0–15.0)
MCH: 31.8 pg (ref 26.0–34.0)
MCHC: 34.2 g/dL (ref 30.0–36.0)
MCV: 93.1 fL (ref 80.0–100.0)
Platelets: 315 10*3/uL (ref 150–400)
RBC: 4.75 MIL/uL (ref 3.87–5.11)
RDW: 13 % (ref 11.5–15.5)
WBC: 17.4 10*3/uL — ABNORMAL HIGH (ref 4.0–10.5)
nRBC: 0 % (ref 0.0–0.2)

## 2022-05-28 LAB — URINALYSIS, MICROSCOPIC (REFLEX): Bacteria, UA: NONE SEEN

## 2022-05-28 LAB — I-STAT BETA HCG BLOOD, ED (MC, WL, AP ONLY): I-stat hCG, quantitative: <5 m[IU]/mL (ref <5)

## 2022-05-28 LAB — SARS CORONAVIRUS 2 BY RT PCR: SARS Coronavirus 2 by RT PCR: NEGATIVE

## 2022-05-28 MED ORDER — PROMETHAZINE HCL 25 MG RE SUPP: 25.0000 mg | Freq: Four times a day (QID) | RECTAL | 0 refills | Status: AC | PRN

## 2022-05-28 MED ORDER — SODIUM CHLORIDE 0.9 % IV BOLUS
1000.0000 mL | Freq: Once | INTRAVENOUS | Status: AC
  Administered 2022-05-28: 1000 mL via INTRAVENOUS

## 2022-05-28 MED ORDER — ONDANSETRON HCL 4 MG/2ML IJ SOLN
4.0000 mg | Freq: Once | INTRAMUSCULAR | Status: AC
  Administered 2022-05-28: 4 mg via INTRAVENOUS
  Filled 2022-05-28: qty 2

## 2022-05-28 MED ORDER — DICYCLOMINE HCL 20 MG PO TABS: 20.0000 mg | ORAL_TABLET | Freq: Two times a day (BID) | ORAL | 0 refills | Status: AC

## 2022-05-28 MED ORDER — DICYCLOMINE HCL 10 MG PO CAPS
10.0000 mg | ORAL_CAPSULE | Freq: Once | ORAL | Status: AC
  Administered 2022-05-28: 10 mg via ORAL
  Filled 2022-05-28: qty 1

## 2022-05-28 NOTE — ED Notes (Signed)
RN reviewed discharge instructions with pt. Pt verbalized understanding and had no further questions. VSS upon discharge.  

## 2022-05-28 NOTE — ED Provider Notes (Signed)
[facility] EMERGENCY DEPARTMENT Provider Note   CSN: [id number] Arrival date & time: 05/28/22  1009     History  No chief complaint on file.   Ashley Santos is a 36 y.o. female.  Patient with history of hypertension and anemia presents today with complaints of nausea, vomiting, and diarrhea.  She states that she ate [employer] for dinner last night and about an hour after started having some lower abdominal cramping and nausea.  States that she woke up around 0700 this morning and had about 2 hours of 'constant' vomiting and diarrhea.  She is concerned for food poisoning.  She states that she has also had some chills without fevers.  Denies any hematuria or dysuria.  Does state that she is currently on her menstrual cycle.  Endorses some mild abdominal cramping.  Denies hematemesis or hematochezia.  No known sick contacts.  The history is provided by the patient. No language interpreter was used.       Home Medications Prior to Admission medications   Medication Sig Start Date End Date Taking? Authorizing Provider  ferrous sulfate 325 (65 FE) MG tablet Take 325 mg by mouth daily with breakfast.    [provider]  hydrochlorothiazide (HYDRODIURIL) 25 MG tablet Take 1 tablet (25 mg total) by mouth daily. 02/26/19   Tresea Mall, CNM  NIFEdipine (PROCARDIA XL/NIFEDICAL-XL) 90 MG 24 hr tablet Take 1 tablet (90 mg total) by mouth daily. 02/24/19   Noralyn Pick, FNP  ondansetron (ZOFRAN ODT) 4 MG disintegrating tablet Take 1 tablet (4 mg total) by mouth every 8 (eight) hours as needed for nausea or vomiting. 06/18/21   Eustaquio Maize, PA-C      Allergies    Patient has no known allergies.    Review of Systems   Review of Systems  Gastrointestinal:  Positive for diarrhea, nausea and vomiting.  All other systems reviewed and are negative.   Physical Exam Updated Vital Signs BP 111/85   Pulse 73   Temp (!) 97.4 F (36.3 C) (Oral)   Resp 14   SpO2  100%  Physical Exam Vitals and nursing note reviewed.  Constitutional:      General: She is not in acute distress.    Appearance: Normal appearance. She is normal weight. She is not ill-appearing, toxic-appearing or diaphoretic.  HENT:     Head: Normocephalic and atraumatic.  Cardiovascular:     Rate and Rhythm: Normal rate and regular rhythm.     Heart sounds: Normal heart sounds.  Pulmonary:     Effort: Pulmonary effort is normal. No respiratory distress.     Breath sounds: Normal breath sounds.  Abdominal:     General: Abdomen is flat.     Palpations: Abdomen is soft.     Tenderness: There is no abdominal tenderness. There is no right CVA tenderness or left CVA tenderness.  Musculoskeletal:        General: Normal range of motion.     Cervical back: Normal range of motion.  Skin:    General: Skin is warm and dry.  Neurological:     General: No focal deficit present.     Mental Status: She is alert.  Psychiatric:        Mood and Affect: Mood normal.        Behavior: Behavior normal.     ED Results / Procedures / Treatments   Labs (all labs ordered are listed, but only abnormal results are displayed) Labs Reviewed  COMPREHENSIVE METABOLIC PANEL - Abnormal; Notable for the following components:      Result Value   Glucose, Bld 145 (*)    All other components within normal limits  CBC - Abnormal; Notable for the following components:   WBC 17.4 (*)    Hemoglobin 15.1 (*)    All other components within normal limits  URINALYSIS, ROUTINE W REFLEX MICROSCOPIC - Abnormal; Notable for the following components:   Color, Urine AMBER (*)    Specific Gravity, Urine >1.030 (*)    Glucose, UA 100 (*)    Hgb urine dipstick LARGE (*)    Bilirubin Urine MODERATE (*)    Ketones, ur 40 (*)    Protein, ur 100 (*)    Nitrite POSITIVE (*)    All other components within normal limits  SARS CORONAVIRUS 2 BY RT PCR  LIPASE, BLOOD  URINALYSIS, MICROSCOPIC (REFLEX)  I-STAT BETA HCG  BLOOD, ED ([facility], [facility], [facility] ONLY)    EKG None  Radiology No results found.  Procedures Procedures    Medications Ordered in ED Medications  sodium chloride 0.9 % bolus 1,000 mL (0 mLs Intravenous Stopped 05/28/22 1509)  ondansetron (ZOFRAN) injection 4 mg (4 mg Intravenous Given 05/28/22 1352)  ondansetron (ZOFRAN) injection 4 mg (4 mg Intravenous Given 05/28/22 1514)    ED Course/ Medical Decision Making/ A&P                           Medical Decision Making Amount and/or Complexity of Data Reviewed Labs: ordered.  Risk Prescription drug management.   This patient presents to the ED for concern of nausea, vomiting, and diarrhea, this involves an extensive number of treatment options, and is a complaint that carries with it a high risk of complications and morbidity.  The differential diagnosis includes cholecystitis, appendicitis, bowel obstruction, viral gastroenteritis.  This is not an exhaustive differential.   Co morbidities that complicate the patient evaluation  Hx hypertension   Lab Tests:  I Ordered, and personally interpreted labs.  The pertinent results include:  UA shows signs of dehydration with ketonuria and proteinuria. Hgb present consistent with patient on her menstrual cycle. WBC 17.4. No other acute laboratory findings   Problem List / ED Course / Critical interventions / Medication management  I ordered medication including fluids and zofran  for nausea/vomiting and dehydration  Reevaluation of the patient after these medicines showed that the patient improved I have reviewed the patients home medicines and have made adjustments as needed  Test / Admission - Considered:  Patient presents today with complaints of nausea, vomiting, and diarrhea. Patient is nontoxic, nonseptic appearing, in no apparent distress.  Patient's pain and other symptoms adequately managed in emergency department.  Fluid bolus given.  Labs and vitals reviewed.  Considered imaging,  however patients abdomen is soft and non-tender and her laboratory evaluation shows dehydration but is overall reassuring. She does have a white count. Patient does not meet the SIRS or Sepsis criteria.  On repeat exam patient does not have a surgical abdomin and there are no peritoneal signs.  No indication of appendicitis, bowel obstruction, bowel perforation, cholecystitis, diverticulitis, PID or ectopic pregnancy. Therefore will defer CT imaging at this time. After fluids and nausea meds, patient states she is feeling significantly improved. She is able to tolerate po without any subsequent episodes of nausea or vomiting.  Suspect that her symptoms are related to viral gastroenteritis which would explain her leukocytosis. Patient  discharged home with phenergan suppositories for symptomatic treatment in addition to the zofran that she already has. Also given strict instructions for follow-up with their primary care physician.  I have also discussed reasons to return immediately to the ER.  Patient expresses understanding and agrees with plan.  Patient discharged in stable condition.  Findings and plan of care discussed with supervising physician Dr. Sherry Ruffing who is in agreement.    Final Clinical Impression(s) / ED Diagnoses Final diagnoses:  Nausea vomiting and diarrhea    Rx / DC Orders ED Discharge Orders          Ordered    promethazine (PHENERGAN) 25 MG suppository  Every 6 hours PRN        05/28/22 1550    dicyclomine (BENTYL) 20 MG tablet  2 times daily        05/28/22 1550          An After Visit Summary was printed and given to the patient.     Nestor Lewandowsky 05/28/22 1625    Tegeler, Gwenyth Allegra, MD 05/28/22 2112

## 2022-05-28 NOTE — ED Triage Notes (Signed)
Patient complains of nausea after eating [employer] last night and developed vomiting and diarrhea 0700. Patient pale on arrival and complains of abdominal cramping. Alert and oriented

## 2022-05-28 NOTE — ED Notes (Signed)
Lab called to add on urine culture to urine sample

## 2022-05-28 NOTE — Discharge Instructions (Addendum)
I have given you a prescription for 2 medications for you to take as prescribed as needed for management of your symptoms.  Follow-up with your PCP in the next few days for continued evaluation and management of your symptoms.  Return if development of any new or worsening symptoms.

## 2022-05-28 NOTE — ED Notes (Signed)
Pt complains of nausea, MD notified

## 2022-05-29 LAB — URINE CULTURE: Culture: NO GROWTH
